# Patient Record
Sex: Male | Born: 1940 | Smoking: Never smoker
Health system: Southern US, Community
[De-identification: ages and names within clinical notes are randomized; demographics above are authoritative.]

---

## 2019-01-06 ENCOUNTER — Inpatient Hospital Stay (HOSPITAL_COMMUNITY)
Admission: AD | Admit: 2019-01-06 | Discharge: 2019-02-06 | DRG: 207 | Disposition: E | Payer: Medicare HMO | Source: Other Acute Inpatient Hospital | Attending: Pulmonary Disease | Admitting: Pulmonary Disease

## 2019-01-06 ENCOUNTER — Inpatient Hospital Stay (HOSPITAL_COMMUNITY): Payer: Medicare HMO

## 2019-01-06 DIAGNOSIS — I10 Essential (primary) hypertension: Secondary | ICD-10-CM | POA: Diagnosis present

## 2019-01-06 DIAGNOSIS — J9601 Acute respiratory failure with hypoxia: Secondary | ICD-10-CM | POA: Diagnosis present

## 2019-01-06 DIAGNOSIS — R739 Hyperglycemia, unspecified: Secondary | ICD-10-CM | POA: Diagnosis not present

## 2019-01-06 DIAGNOSIS — J1289 Other viral pneumonia: Secondary | ICD-10-CM | POA: Diagnosis present

## 2019-01-06 DIAGNOSIS — E875 Hyperkalemia: Secondary | ICD-10-CM | POA: Diagnosis not present

## 2019-01-06 DIAGNOSIS — R1110 Vomiting, unspecified: Secondary | ICD-10-CM | POA: Diagnosis not present

## 2019-01-06 DIAGNOSIS — I959 Hypotension, unspecified: Secondary | ICD-10-CM | POA: Diagnosis not present

## 2019-01-06 DIAGNOSIS — Z79890 Hormone replacement therapy (postmenopausal): Secondary | ICD-10-CM

## 2019-01-06 DIAGNOSIS — M792 Neuralgia and neuritis, unspecified: Secondary | ICD-10-CM | POA: Diagnosis present

## 2019-01-06 DIAGNOSIS — R001 Bradycardia, unspecified: Secondary | ICD-10-CM | POA: Diagnosis present

## 2019-01-06 DIAGNOSIS — N170 Acute kidney failure with tubular necrosis: Secondary | ICD-10-CM | POA: Diagnosis not present

## 2019-01-06 DIAGNOSIS — T17908A Unspecified foreign body in respiratory tract, part unspecified causing other injury, initial encounter: Secondary | ICD-10-CM

## 2019-01-06 DIAGNOSIS — F419 Anxiety disorder, unspecified: Secondary | ICD-10-CM | POA: Diagnosis present

## 2019-01-06 DIAGNOSIS — G20A1 Parkinson's disease without dyskinesia, without mention of fluctuations: Secondary | ICD-10-CM | POA: Diagnosis present

## 2019-01-06 DIAGNOSIS — N179 Acute kidney failure, unspecified: Secondary | ICD-10-CM

## 2019-01-06 DIAGNOSIS — Z95828 Presence of other vascular implants and grafts: Secondary | ICD-10-CM

## 2019-01-06 DIAGNOSIS — D6489 Other specified anemias: Secondary | ICD-10-CM | POA: Diagnosis present

## 2019-01-06 DIAGNOSIS — T380X5A Adverse effect of glucocorticoids and synthetic analogues, initial encounter: Secondary | ICD-10-CM | POA: Diagnosis not present

## 2019-01-06 DIAGNOSIS — J9690 Respiratory failure, unspecified, unspecified whether with hypoxia or hypercapnia: Secondary | ICD-10-CM

## 2019-01-06 DIAGNOSIS — R609 Edema, unspecified: Secondary | ICD-10-CM | POA: Diagnosis not present

## 2019-01-06 DIAGNOSIS — U071 COVID-19: Secondary | ICD-10-CM | POA: Diagnosis present

## 2019-01-06 DIAGNOSIS — T85598A Other mechanical complication of other gastrointestinal prosthetic devices, implants and grafts, initial encounter: Secondary | ICD-10-CM

## 2019-01-06 DIAGNOSIS — E785 Hyperlipidemia, unspecified: Secondary | ICD-10-CM | POA: Diagnosis present

## 2019-01-06 DIAGNOSIS — E039 Hypothyroidism, unspecified: Secondary | ICD-10-CM | POA: Diagnosis present

## 2019-01-06 DIAGNOSIS — G9341 Metabolic encephalopathy: Secondary | ICD-10-CM | POA: Diagnosis not present

## 2019-01-06 DIAGNOSIS — J80 Acute respiratory distress syndrome: Secondary | ICD-10-CM | POA: Diagnosis not present

## 2019-01-06 DIAGNOSIS — G20 Parkinson's disease: Secondary | ICD-10-CM | POA: Diagnosis present

## 2019-01-06 LAB — PHOSPHORUS: Phosphorus: 3.8 mg/dL (ref 2.5–4.6)

## 2019-01-06 LAB — CBC WITH DIFFERENTIAL/PLATELET
Abs Immature Granulocytes: 0.05 10*3/uL (ref 0.00–0.07)
Basophils Absolute: 0 10*3/uL (ref 0.0–0.1)
Basophils Relative: 0 %
Eosinophils Absolute: 0 10*3/uL (ref 0.0–0.5)
Eosinophils Relative: 0 %
HCT: 37.3 % — ABNORMAL LOW (ref 39.0–52.0)
Hemoglobin: 11.7 g/dL — ABNORMAL LOW (ref 13.0–17.0)
Immature Granulocytes: 1 %
Lymphocytes Relative: 4 %
Lymphs Abs: 0.3 10*3/uL — ABNORMAL LOW (ref 0.7–4.0)
MCH: 28.9 pg (ref 26.0–34.0)
MCHC: 31.4 g/dL (ref 30.0–36.0)
MCV: 92.1 fL (ref 80.0–100.0)
Monocytes Absolute: 0.6 10*3/uL (ref 0.1–1.0)
Monocytes Relative: 8 %
Neutro Abs: 6.6 10*3/uL (ref 1.7–7.7)
Neutrophils Relative %: 87 %
Platelets: 152 10*3/uL (ref 150–400)
RBC: 4.05 MIL/uL — ABNORMAL LOW (ref 4.22–5.81)
RDW: 14.3 % (ref 11.5–15.5)
WBC: 7.7 10*3/uL (ref 4.0–10.5)
nRBC: 0 % (ref 0.0–0.2)

## 2019-01-06 LAB — COMPREHENSIVE METABOLIC PANEL
ALT: 14 U/L (ref 0–44)
AST: 24 U/L (ref 15–41)
Albumin: 2.6 g/dL — ABNORMAL LOW (ref 3.5–5.0)
Alkaline Phosphatase: 71 U/L (ref 38–126)
Anion gap: 10 (ref 5–15)
BUN: 42 mg/dL — ABNORMAL HIGH (ref 8–23)
CO2: 21 mmol/L — ABNORMAL LOW (ref 22–32)
Calcium: 8 mg/dL — ABNORMAL LOW (ref 8.9–10.3)
Chloride: 109 mmol/L (ref 98–111)
Creatinine, Ser: 1.12 mg/dL (ref 0.61–1.24)
GFR calc Af Amer: >60 mL/min (ref >60)
GFR calc non Af Amer: >60 mL/min (ref >60)
Glucose, Bld: 99 mg/dL (ref 70–99)
Potassium: 5.2 mmol/L — ABNORMAL HIGH (ref 3.5–5.1)
Sodium: 140 mmol/L (ref 135–145)
Total Bilirubin: 0.5 mg/dL (ref 0.3–1.2)
Total Protein: 5.9 g/dL — ABNORMAL LOW (ref 6.5–8.1)

## 2019-01-06 LAB — POCT I-STAT 7, (LYTES, BLD GAS, ICA,H+H)
Acid-base deficit: 5 mmol/L — ABNORMAL HIGH (ref 0.0–2.0)
Bicarbonate: 19.5 mmol/L — ABNORMAL LOW (ref 20.0–28.0)
Calcium, Ion: 1.17 mmol/L (ref 1.15–1.40)
HCT: 36 % — ABNORMAL LOW (ref 39.0–52.0)
Hemoglobin: 12.2 g/dL — ABNORMAL LOW (ref 13.0–17.0)
O2 Saturation: 87 %
Patient temperature: 98.7
Potassium: 5.1 mmol/L (ref 3.5–5.1)
Sodium: 138 mmol/L (ref 135–145)
TCO2: 21 mmol/L — ABNORMAL LOW (ref 22–32)
pCO2 arterial: 35.7 mmHg (ref 32.0–48.0)
pH, Arterial: 7.346 — ABNORMAL LOW (ref 7.350–7.450)
pO2, Arterial: 55 mmHg — ABNORMAL LOW (ref 83.0–108.0)

## 2019-01-06 LAB — BRAIN NATRIURETIC PEPTIDE: B Natriuretic Peptide: 177 pg/mL — ABNORMAL HIGH (ref 0.0–100.0)

## 2019-01-06 LAB — GLUCOSE, CAPILLARY: Glucose-Capillary: 93 mg/dL (ref 70–99)

## 2019-01-06 LAB — TROPONIN I (HIGH SENSITIVITY): Troponin I (High Sensitivity): 92 ng/L — ABNORMAL HIGH (ref <18)

## 2019-01-06 LAB — LACTATE DEHYDROGENASE: LDH: 510 U/L — ABNORMAL HIGH (ref 98–192)

## 2019-01-06 LAB — D-DIMER, QUANTITATIVE: D-Dimer, Quant: 13.06 ug/mL-FEU — ABNORMAL HIGH (ref 0.00–0.50)

## 2019-01-06 LAB — FERRITIN: Ferritin: 744 ng/mL — ABNORMAL HIGH (ref 24–336)

## 2019-01-06 LAB — MAGNESIUM: Magnesium: 2.3 mg/dL (ref 1.7–2.4)

## 2019-01-06 LAB — PROCALCITONIN: Procalcitonin: 2.43 ng/mL

## 2019-01-06 LAB — FIBRINOGEN: Fibrinogen: 442 mg/dL (ref 210–475)

## 2019-01-06 LAB — C-REACTIVE PROTEIN: CRP: 11.4 mg/dL — ABNORMAL HIGH (ref <1.0)

## 2019-01-06 MED ORDER — DEXAMETHASONE 6 MG PO TABS
6.0000 mg | ORAL_TABLET | ORAL | Status: DC
  Administered 2019-01-07 – 2019-01-15 (×9): 6 mg
  Filled 2019-01-06 (×10): qty 1

## 2019-01-06 MED ORDER — ORAL CARE MOUTH RINSE
15.0000 mL | OROMUCOSAL | Status: DC
  Administered 2019-01-06 – 2019-01-16 (×93): 15 mL via OROMUCOSAL

## 2019-01-06 MED ORDER — FENTANYL CITRATE (PF) 100 MCG/2ML IJ SOLN: 25.0000 ug | INTRAMUSCULAR | Status: DC | PRN

## 2019-01-06 MED ORDER — ALFENTANIL HCL 500 MCG/ML IJ INJ
20.00 | INJECTION | INTRAMUSCULAR | Status: DC
Start: 2019-01-06 — End: 2019-01-06

## 2019-01-06 MED ORDER — AMOXICILLIN-POT CLAVULANATE (PENICILLIN COMBINATIONS)
325.00 | Status: DC
Start: 2019-01-07 — End: 2019-01-06

## 2019-01-06 MED ORDER — VITAL HIGH PROTEIN PO LIQD
1000.0000 mL | ORAL | Status: DC
  Administered 2019-01-06: 1000 mL

## 2019-01-06 MED ORDER — SODIUM CHLORIDE 0.9 % IV SOLN
100.0000 mg | Freq: Once | INTRAVENOUS | Status: AC
  Administered 2019-01-06: 100 mg via INTRAVENOUS
  Filled 2019-01-06: qty 100

## 2019-01-06 MED ORDER — CALCIUM-VITAMIN D-VITAMIN K 600-1000-90 MG-UNT-MCG PO TABS
0.00 | ORAL_TABLET | ORAL | Status: DC
Start: ? — End: 2019-01-06

## 2019-01-06 MED ORDER — PRO-STAT SUGAR FREE PO LIQD
30.0000 mL | Freq: Two times a day (BID) | ORAL | Status: DC
  Administered 2019-01-06 – 2019-01-07 (×2): 30 mL
  Filled 2019-01-06 (×2): qty 30

## 2019-01-06 MED ORDER — EQL SHEER SPOTS SMALL MISC
0.00 | Status: DC
Start: ? — End: 2019-01-06

## 2019-01-06 MED ORDER — EMBRACE TOPFILL 1000ML BAG SET MISC
1.00 | Status: DC
Start: 2019-01-06 — End: 2019-01-06

## 2019-01-06 MED ORDER — DOCUSATE SODIUM 50 MG/5ML PO LIQD: 100.0000 mg | Freq: Two times a day (BID) | ORAL | Status: DC | PRN

## 2019-01-06 MED ORDER — PROPOFOL 1000 MG/100ML IV EMUL
0.0000 ug/kg/min | INTRAVENOUS | Status: DC
  Administered 2019-01-06: 30 ug/kg/min via INTRAVENOUS

## 2019-01-06 MED ORDER — LEVOTHYROXINE SODIUM 100 MCG PO TABS
100.0000 ug | ORAL_TABLET | Freq: Every day | ORAL | Status: DC
  Administered 2019-01-07 – 2019-01-15 (×9): 100 ug
  Filled 2019-01-06 (×11): qty 1

## 2019-01-06 MED ORDER — ONDANSETRON HCL 4 MG/2ML IJ SOLN: 4.0000 mg | Freq: Four times a day (QID) | INTRAMUSCULAR | Status: DC | PRN

## 2019-01-06 MED ORDER — ACETAMINOPHEN 325 MG PO TABS: 650.0000 mg | ORAL_TABLET | ORAL | Status: DC | PRN

## 2019-01-06 MED ORDER — CELLULOSE SODIUM PHOSPHATE VI
5000.00 | Status: DC
Start: 2019-01-06 — End: 2019-01-06

## 2019-01-06 MED ORDER — NEXTERONE 150-4.21 MG/100ML-% IV SOLN
50.00 | INTRAVENOUS | Status: DC
Start: 2019-01-06 — End: 2019-01-06

## 2019-01-06 MED ORDER — VITAMIN C 500 MG PO TABS
500.0000 mg | ORAL_TABLET | Freq: Every day | ORAL | Status: DC
  Administered 2019-01-06 – 2019-01-11 (×6): 500 mg
  Filled 2019-01-06 (×6): qty 1

## 2019-01-06 MED ORDER — PROPOFOL 1000 MG/100ML IV EMUL: 0.0000 ug/kg/min | INTRAVENOUS | Status: DC

## 2019-01-06 MED ORDER — MIDAZOLAM HCL 2 MG/2ML IJ SOLN
1.0000 mg | INTRAMUSCULAR | Status: DC | PRN
  Filled 2019-01-06: qty 2

## 2019-01-06 MED ORDER — MESNA 400 MG PO TABS
100.00 | ORAL_TABLET | ORAL | Status: DC
Start: 2019-01-07 — End: 2019-01-06

## 2019-01-06 MED ORDER — SODIUM CHLORIDE 0.9 % IV SOLN
100.0000 mg | Freq: Every day | INTRAVENOUS | Status: AC
  Administered 2019-01-07 – 2019-01-09 (×3): 100 mg via INTRAVENOUS
  Filled 2019-01-06 (×3): qty 100

## 2019-01-06 MED ORDER — DOVER URINARY LEG BAG MISC
Status: DC
Start: ? — End: 2019-01-06

## 2019-01-06 MED ORDER — Medication
100.00 | Status: DC
Start: 2019-01-07 — End: 2019-01-06

## 2019-01-06 MED ORDER — WATER BOTTLE ECONOMY #15 MISC
20.00 | Status: DC
Start: 2019-01-06 — End: 2019-01-06

## 2019-01-06 MED ORDER — FAMOTIDINE IN NACL 20-0.9 MG/50ML-% IV SOLN
20.0000 mg | Freq: Two times a day (BID) | INTRAVENOUS | Status: DC
  Administered 2019-01-06 – 2019-01-10 (×8): 20 mg via INTRAVENOUS
  Filled 2019-01-06 (×8): qty 50

## 2019-01-06 MED ORDER — MIDAZOLAM HCL 2 MG/2ML IJ SOLN
1.0000 mg | INTRAMUSCULAR | Status: AC | PRN
  Administered 2019-01-06 – 2019-01-07 (×3): 1 mg via INTRAVENOUS
  Filled 2019-01-06 (×2): qty 2

## 2019-01-06 MED ORDER — Medication
100.00 | Status: DC
Start: 2019-01-06 — End: 2019-01-06

## 2019-01-06 MED ORDER — NALOXONE HCL 0.4 MG/ML IJ SOLN [OVERRIDE RECORD] [AGE NEONATE]
0.00 | Status: DC
Start: ? — End: 2019-01-06

## 2019-01-06 MED ORDER — Medication
5.00 | Status: DC
Start: 2019-01-06 — End: 2019-01-06

## 2019-01-06 MED ORDER — ZINC SULFATE 220 (50 ZN) MG PO CAPS
220.0000 mg | ORAL_CAPSULE | Freq: Every day | ORAL | Status: DC
  Administered 2019-01-06 – 2019-01-11 (×6): 220 mg
  Filled 2019-01-06 (×6): qty 1

## 2019-01-06 MED ORDER — CHLORHEXIDINE GLUCONATE 0.12% ORAL RINSE (MEDLINE KIT)
15.0000 mL | Freq: Two times a day (BID) | OROMUCOSAL | Status: DC
  Administered 2019-01-06 – 2019-01-15 (×19): 15 mL via OROMUCOSAL

## 2019-01-06 MED ORDER — V-R ANTI-GAS MAXIMUM STRENGTH 166 MG PO CAPS
6.00 | ORAL_CAPSULE | ORAL | Status: DC
Start: 2019-01-07 — End: 2019-01-06

## 2019-01-06 MED ORDER — FENTANYL 2500MCG IN NS 250ML (10MCG/ML) PREMIX INFUSION
0.0000 ug/h | INTRAVENOUS | Status: DC
  Administered 2019-01-06: 25 ug/h via INTRAVENOUS
  Administered 2019-01-07: 350 ug/h via INTRAVENOUS
  Administered 2019-01-07: 300 ug/h via INTRAVENOUS
  Administered 2019-01-07 – 2019-01-11 (×12): 350 ug/h via INTRAVENOUS
  Administered 2019-01-11 (×2): 325 ug/h via INTRAVENOUS
  Administered 2019-01-12 (×3): 300 ug/h via INTRAVENOUS
  Administered 2019-01-13: 400 ug/h via INTRAVENOUS
  Administered 2019-01-13 (×2): 300 ug/h via INTRAVENOUS
  Administered 2019-01-14 – 2019-01-15 (×5): 350 ug/h via INTRAVENOUS
  Filled 2019-01-06 (×24): qty 250

## 2019-01-06 MED ORDER — Medication
1.00 | Status: DC
Start: ? — End: 2019-01-06

## 2019-01-06 MED ORDER — ENOXAPARIN SODIUM 40 MG/0.4ML ~~LOC~~ SOLN
40.0000 mg | Freq: Two times a day (BID) | SUBCUTANEOUS | Status: DC
  Administered 2019-01-06 – 2019-01-15 (×19): 40 mg via SUBCUTANEOUS
  Filled 2019-01-06 (×19): qty 0.4

## 2019-01-06 MED ORDER — Medication
2.00 | Status: DC
Start: 2019-01-07 — End: 2019-01-06

## 2019-01-06 NOTE — Progress Notes (Signed)
[place] Progress Note Patient Name: ROLLY MAGRI Sr. DOB: 03-10-1940 MRN: [id number]   Date of Service  Jan 08, 2019  HPI/Events of Note  Bradycardia - Likely d/t Propofol IV infusion.   eICU Interventions  Will order: 1. D/C Propofol IV infusion. 2. Fentanyl IV infusion. Titrate to RASS = 0 to -1.         Markeita Alicia Cornelia Copa January 08, 2019, 8:23 PM

## 2019-01-06 NOTE — H&P (Signed)
NAME:  Carl Piedra., MRN:  [id number], DOB:  Sep 10, 1940, LOS: 0 ADMISSION DATE:  01/29/2019, CONSULTATION DATE:  01/29/2019 REFERRING MD: Venora Maples , CHIEF COMPLAINT:  dyspnea   Brief History   78 y/o male with baseline hypertension, parkinson's disease admitted on 12/1 with COVID pneumonia causing severe acute respiratory failure with hypoxemia.   History of present illness   78 year old male presented on transfer from [facility] today after he was intubated at 2 AM in the setting of severe acute respiratory failure with hypoxemia from COVID-19 pneumonia.  He had a Covid test 7 days prior to arrival on November 23 which was positive.  His daughter was also positive. .  He had been experiencing chills and body aches.  He developed shortness of breath and came to the emergency department on 11/30.  He was initially treated with heated high flow oxygen but his oxygen saturation dropped and so he was intubated around 2 AM on January 06, 2019.  He was transferred to our facility because no other hospitals had beds available.  Past Medical History  Hypertension Parkinson's disease Hypothyroidism Peripheral neuralgia  Significant Hospital Events   December 1 admission  Consults:    Procedures:  December 1 endotracheal tube  Significant Diagnostic Tests:    Micro Data:  11/23 SARS COV 2 positive OSH  Antimicrobials/COVID Rx  12/1 decadron  12/1 remdesivir  Interim history/subjective:  As above  Objective   There were no vitals taken for this visit. PAP: ()/()      No intake or output data in the 24 hours ending 01/07/2019 1848 There were no vitals filed for this visit.  Examination:  General:  In bed on vent HENT: NCAT ETT in place PULM: CTA B, vent supported breathing CV: RRR, no mgr GI: BS+, soft, nontender MSK: normal bulk and tone Neuro: sedated on vent  I reviewed the records from his ER visit at [facility] where he was noted to  have mild acute kidney injury with a creatinine of 1.6.  Labs are otherwise unremarkable.  Resolved Hospital Problem list     Assessment & Plan:  Severe acute respiratory failure with hypoxemia secondary to COVID-19 pneumonia: Admit to ICU Chest x-ray Decadron Remdesivir Check LFTs, hepatitis function to consider Actemra Consider convalescent plasma Continue mechanical ventilation per ARDS protocol Target TVol 6-8cc/kgIBW Target Plateau Pressure < 30cm H20 Target driving pressure less than 15 cm of water Target PaO2 55-65: titrate PEEP/FiO2 per protocol As long as PaO2 to FiO2 ratio is less than 1:150 position in prone position for 16 hours a day Check CVP daily if CVL in place Target CVP less than 4, diurese as necessary Ventilator associated pneumonia prevention protocol  AKI Repeat labs Start tube feeding  Need for sedation for mechanical ventilation PAD protocol Goal RASS -2 Propofol infusion Prn fentanyl and versed  Parkinson's Restart sinemet on 12/2  Hypothyroid syntrhoid 149mcg daily  Best practice:  Diet: tube feeding Pain/Anxiety/Delirium protocol (if indicated): yes VAP protocol (if indicated): yes DVT prophylaxis: lovenox per ICU protocol GI prophylaxis: famotidine Glucose control: SSI Mobility: bed rest Code Status: full Family Communication: called daughter, left message Disposition: remain in ICU  Labs   CBC: No results for input(s): WBC, NEUTROABS, HGB, HCT, MCV, PLT in the last 168 hours.  Basic Metabolic Panel: No results for input(s): NA, K, CL, CO2, GLUCOSE, BUN, CREATININE, CALCIUM, MG, PHOS in the last 168 hours. GFR: CrCl cannot be calculated (No successful lab  value found.). No results for input(s): PROCALCITON, WBC, LATICACIDVEN in the last 168 hours.  Liver Function Tests: No results for input(s): AST, ALT, ALKPHOS, BILITOT, PROT, ALBUMIN in the last 168 hours. No results for input(s): LIPASE, AMYLASE in the last 168 hours. No  results for input(s): AMMONIA in the last 168 hours.  ABG No results found for: PHART, PCO2ART, PO2ART, HCO3, TCO2, ACIDBASEDEF, O2SAT   Coagulation Profile: No results for input(s): INR, PROTIME in the last 168 hours.  Cardiac Enzymes: No results for input(s): CKTOTAL, CKMB, CKMBINDEX, TROPONINI in the last 168 hours.  HbA1C: No results found for: HGBA1C  CBG: No results for input(s): GLUCAP in the last 168 hours.  Review of Systems:   Cannot obtain due intubation  Past Medical History  Hypertension Parkinson's hypothyroid  Surgical History   Cannot obtain  Social History      Family History   His family history is not on file.   Allergies Not on File   Home Medications  Prior to Admission medications   Not on File     Critical care time: 45 minutes    Heber [place], MD [place] [facility] Pager: [phone] Cell: [phone] If no response, call [phone]

## 2019-01-06 NOTE — Progress Notes (Signed)
Pharmacy Note - Remdesivir Dosing  78 yo male transferred from [facility] today with severe acute respiratory failure with hypoxemia from COVID-19 pneumonia.   O:  ALT: 15 Requiring supplemental O2: intubated    A/P:  Patient meets criteria for remdesivir.  Per review of Care Everywhere, patient received remdesivir 200 mg IV x 1 on 01/05/19 at 16:35.  Will continue with 100 mg IV daily x 4 days to complete therapy on 12/4 Monitor ALT, clinical progress  Peggyann Juba, PharmD, [place] [phone] 02/04/2019 7:15 PM

## 2019-01-06 NOTE — Progress Notes (Signed)
Received pt from [facility] on ventilator. Pt stable throughout with no complications. ETT secure at 24.

## 2019-01-06 NOTE — Progress Notes (Signed)
Pt arrived to unit via stretcher accompanied by RNs. Pt intubated and sedated with Fentanyl and Propofol infusing through 2 PIVs. Sinus brady in the 40s with SBP in the 130s. Pt on ventilator with FiO2 settings at 100% PEEP of 12, oxygen saturation at 100%. OG tube in place, clamped. Foley catheter present upon arrival. Propofol continued at 30 mcg/kg/min. Dr. [place] Bells present to evaluate pt. No s/s of distress at this time.

## 2019-01-07 ENCOUNTER — Encounter (HOSPITAL_COMMUNITY): Payer: Self-pay

## 2019-01-07 ENCOUNTER — Other Ambulatory Visit: Payer: Self-pay

## 2019-01-07 DIAGNOSIS — J80 Acute respiratory distress syndrome: Secondary | ICD-10-CM | POA: Diagnosis not present

## 2019-01-07 DIAGNOSIS — G20 Parkinson's disease: Secondary | ICD-10-CM | POA: Diagnosis present

## 2019-01-07 DIAGNOSIS — U071 COVID-19: Secondary | ICD-10-CM | POA: Diagnosis not present

## 2019-01-07 DIAGNOSIS — G20A1 Parkinson's disease without dyskinesia, without mention of fluctuations: Secondary | ICD-10-CM | POA: Diagnosis present

## 2019-01-07 DIAGNOSIS — R001 Bradycardia, unspecified: Secondary | ICD-10-CM | POA: Diagnosis not present

## 2019-01-07 LAB — TYPE AND SCREEN
ABO/RH(D): A POS
Antibody Screen: NEGATIVE

## 2019-01-07 LAB — CBC
HCT: 37.3 % — ABNORMAL LOW (ref 39.0–52.0)
Hemoglobin: 11.5 g/dL — ABNORMAL LOW (ref 13.0–17.0)
MCH: 29.2 pg (ref 26.0–34.0)
MCHC: 30.8 g/dL (ref 30.0–36.0)
MCV: 94.7 fL (ref 80.0–100.0)
Platelets: 151 10*3/uL (ref 150–400)
RBC: 3.94 MIL/uL — ABNORMAL LOW (ref 4.22–5.81)
RDW: 14.4 % (ref 11.5–15.5)
WBC: 7.6 10*3/uL (ref 4.0–10.5)
nRBC: 0 % (ref 0.0–0.2)

## 2019-01-07 LAB — BASIC METABOLIC PANEL
Anion gap: 11 (ref 5–15)
BUN: 46 mg/dL — ABNORMAL HIGH (ref 8–23)
CO2: 20 mmol/L — ABNORMAL LOW (ref 22–32)
Calcium: 8 mg/dL — ABNORMAL LOW (ref 8.9–10.3)
Chloride: 110 mmol/L (ref 98–111)
Creatinine, Ser: 1 mg/dL (ref 0.61–1.24)
GFR calc Af Amer: >60 mL/min (ref >60)
GFR calc non Af Amer: >60 mL/min (ref >60)
Glucose, Bld: 96 mg/dL (ref 70–99)
Potassium: 5 mmol/L (ref 3.5–5.1)
Sodium: 141 mmol/L (ref 135–145)

## 2019-01-07 LAB — GLUCOSE, CAPILLARY
Glucose-Capillary: 109 mg/dL — ABNORMAL HIGH (ref 70–99)
Glucose-Capillary: 150 mg/dL — ABNORMAL HIGH (ref 70–99)
Glucose-Capillary: 162 mg/dL — ABNORMAL HIGH (ref 70–99)
Glucose-Capillary: 89 mg/dL (ref 70–99)
Glucose-Capillary: 95 mg/dL (ref 70–99)
Glucose-Capillary: 99 mg/dL (ref 70–99)

## 2019-01-07 LAB — PHOSPHORUS
Phosphorus: 4.2 mg/dL (ref 2.5–4.6)
Phosphorus: 4 mg/dL (ref 2.5–4.6)

## 2019-01-07 LAB — ABO/RH: ABO/RH(D): A POS

## 2019-01-07 LAB — TRIGLYCERIDES: Triglycerides: 117 mg/dL (ref <150)

## 2019-01-07 LAB — HEPATITIS B SURFACE ANTIGEN: Hepatitis B Surface Ag: NONREACTIVE

## 2019-01-07 LAB — MAGNESIUM
Magnesium: 2.3 mg/dL (ref 1.7–2.4)
Magnesium: 2.3 mg/dL (ref 1.7–2.4)

## 2019-01-07 LAB — MRSA PCR SCREENING: MRSA by PCR: NEGATIVE

## 2019-01-07 MED ORDER — VECURONIUM BROMIDE 10 MG IV SOLR
INTRAVENOUS | Status: AC
  Administered 2019-01-07: 7.4 mg via INTRAVENOUS
  Filled 2019-01-07: qty 10

## 2019-01-07 MED ORDER — CARBIDOPA-LEVODOPA 25-100 MG PO TABS
1.0000 | ORAL_TABLET | Freq: Three times a day (TID) | ORAL | Status: DC
  Administered 2019-01-07 – 2019-01-15 (×26): 1
  Filled 2019-01-07 (×31): qty 1

## 2019-01-07 MED ORDER — VITAL AF 1.2 CAL PO LIQD
1000.0000 mL | ORAL | Status: DC
  Administered 2019-01-07 – 2019-01-08 (×2): 1000 mL

## 2019-01-07 MED ORDER — POLYVINYL ALCOHOL 1.4 % OP SOLN
1.0000 [drp] | Freq: Three times a day (TID) | OPHTHALMIC | Status: DC | PRN
  Administered 2019-01-07 – 2019-01-15 (×4): 1 [drp] via OPHTHALMIC
  Filled 2019-01-07: qty 15

## 2019-01-07 MED ORDER — MIDAZOLAM 50MG/50ML (1MG/ML) PREMIX INFUSION
0.5000 mg/h | INTRAVENOUS | Status: DC
  Filled 2019-01-07: qty 50

## 2019-01-07 MED ORDER — POLYETHYLENE GLYCOL 3350 17 G PO PACK
17.0000 g | PACK | Freq: Every evening | ORAL | Status: DC
  Administered 2019-01-07 – 2019-01-14 (×6): 17 g
  Filled 2019-01-07 (×6): qty 1

## 2019-01-07 MED ORDER — MIDAZOLAM 50MG/50ML (1MG/ML) PREMIX INFUSION
2.0000 mg/h | INTRAVENOUS | Status: DC
  Administered 2019-01-08: 4 mg/h via INTRAVENOUS
  Administered 2019-01-08: 3 mg/h via INTRAVENOUS
  Administered 2019-01-09: 6 mg/h via INTRAVENOUS
  Administered 2019-01-09 (×2): 4 mg/h via INTRAVENOUS
  Administered 2019-01-10 – 2019-01-12 (×5): 6 mg/h via INTRAVENOUS
  Administered 2019-01-12: 5 mg/h via INTRAVENOUS
  Administered 2019-01-12: 6 mg/h via INTRAVENOUS
  Administered 2019-01-13 (×2): 5 mg/h via INTRAVENOUS
  Administered 2019-01-14 (×2): 7 mg/h via INTRAVENOUS
  Administered 2019-01-14: 6 mg/h via INTRAVENOUS
  Administered 2019-01-15: 8 mg/h via INTRAVENOUS
  Administered 2019-01-15 (×2): 7 mg/h via INTRAVENOUS
  Administered 2019-01-15: 8 mg/h via INTRAVENOUS
  Filled 2019-01-07 (×25): qty 50

## 2019-01-07 MED ORDER — FREE WATER
100.0000 mL | Status: DC
  Administered 2019-01-07 – 2019-01-12 (×29): 100 mL

## 2019-01-07 MED ORDER — ARTIFICIAL TEARS OPHTHALMIC OINT
TOPICAL_OINTMENT | Freq: Three times a day (TID) | OPHTHALMIC | Status: DC
  Administered 2019-01-07 (×3): via OPHTHALMIC
  Filled 2019-01-07: qty 3.5
  Filled 2019-01-07: qty 7

## 2019-01-07 MED ORDER — ASPIRIN 81 MG PO CHEW
81.0000 mg | CHEWABLE_TABLET | Freq: Every day | ORAL | Status: DC
  Administered 2019-01-07 – 2019-01-11 (×5): 81 mg
  Filled 2019-01-07 (×5): qty 1

## 2019-01-07 MED ORDER — VECURONIUM BROMIDE 10 MG IV SOLR
0.1000 mg/kg | INTRAVENOUS | Status: DC | PRN
  Administered 2019-01-07 (×3): 7.4 mg via INTRAVENOUS
  Filled 2019-01-07 (×2): qty 10

## 2019-01-07 MED ORDER — FENTANYL 2500MCG IN NS 250ML (10MCG/ML) PREMIX INFUSION
25.0000 ug/h | INTRAVENOUS | Status: DC
  Filled 2019-01-07 (×4): qty 250

## 2019-01-07 MED ORDER — FENTANYL BOLUS VIA INFUSION
25.0000 ug | INTRAVENOUS | Status: DC | PRN
  Administered 2019-01-09: 25 ug via INTRAVENOUS
  Filled 2019-01-07: qty 25

## 2019-01-07 MED ORDER — CHLORHEXIDINE GLUCONATE CLOTH 2 % EX PADS
6.0000 | MEDICATED_PAD | Freq: Every day | CUTANEOUS | Status: DC
  Administered 2019-01-07 – 2019-01-15 (×8): 6 via TOPICAL

## 2019-01-07 MED ORDER — VECURONIUM BROMIDE 10 MG IV SOLR: 0.1000 mg/kg | INTRAVENOUS | Status: DC | PRN

## 2019-01-07 MED ORDER — ROSUVASTATIN CALCIUM 20 MG PO TABS
20.0000 mg | ORAL_TABLET | Freq: Every day | ORAL | Status: DC
  Administered 2019-01-07 – 2019-01-11 (×5): 20 mg
  Filled 2019-01-07 (×5): qty 1

## 2019-01-07 MED ORDER — MIDAZOLAM HCL 2 MG/2ML IJ SOLN
2.0000 mg | INTRAMUSCULAR | Status: DC | PRN
  Administered 2019-01-07 (×3): 2 mg via INTRAVENOUS
  Filled 2019-01-07 (×2): qty 2

## 2019-01-07 MED ORDER — VECURONIUM BROMIDE 10 MG IV SOLR: 0.1000 mg/kg | Freq: Once | INTRAVENOUS | Status: AC

## 2019-01-07 MED ORDER — VECURONIUM BROMIDE 10 MG IV SOLR
0.1000 mg/kg | INTRAVENOUS | Status: DC | PRN
  Administered 2019-01-07 – 2019-01-14 (×10): 7.4 mg via INTRAVENOUS
  Filled 2019-01-07 (×10): qty 10

## 2019-01-07 MED ORDER — MIDAZOLAM BOLUS VIA INFUSION
1.0000 mg | INTRAVENOUS | Status: DC | PRN
  Administered 2019-01-09: 2 mg via INTRAVENOUS
  Filled 2019-01-07: qty 2

## 2019-01-07 MED ORDER — ARTIFICIAL TEARS OPHTHALMIC OINT
1.0000 "application " | TOPICAL_OINTMENT | Freq: Three times a day (TID) | OPHTHALMIC | Status: DC
  Administered 2019-01-07 – 2019-01-15 (×25): 1 via OPHTHALMIC
  Filled 2019-01-07: qty 3.5

## 2019-01-07 MED ORDER — FENTANYL CITRATE (PF) 100 MCG/2ML IJ SOLN: 25.0000 ug | Freq: Once | INTRAMUSCULAR | Status: AC

## 2019-01-07 MED ORDER — PREGABALIN 50 MG PO CAPS
50.0000 mg | ORAL_CAPSULE | Freq: Three times a day (TID) | ORAL | Status: DC
  Administered 2019-01-07 – 2019-01-11 (×15): 50 mg
  Filled 2019-01-07 (×15): qty 1

## 2019-01-07 MED ORDER — CITALOPRAM HYDROBROMIDE 20 MG PO TABS
20.0000 mg | ORAL_TABLET | Freq: Every evening | ORAL | Status: DC
  Administered 2019-01-07 – 2019-01-15 (×9): 20 mg
  Filled 2019-01-07 (×10): qty 1

## 2019-01-07 MED ORDER — BUSPIRONE HCL 5 MG PO TABS
5.0000 mg | ORAL_TABLET | Freq: Two times a day (BID) | ORAL | Status: DC
  Administered 2019-01-07 – 2019-01-11 (×10): 5 mg
  Filled 2019-01-07 (×11): qty 1

## 2019-01-07 NOTE — Progress Notes (Addendum)
Initial Nutrition Assessment RD working remotely.  DOCUMENTATION CODES:   Not applicable  INTERVENTION:    Vital AF 1.2 at 75 ml/h (1800 ml per day).   Provides 2160 kcal, 135 gm protein, 1460 ml free water daily.   Free water flushes 100 ml every 4 hours for a total of 2060 ml per day total.  NUTRITION DIAGNOSIS:   Increased nutrient needs related to acute illness(COVID-19) as evidenced by estimated needs.  GOAL:   Patient will meet greater than or equal to 90% of their needs  MONITOR:   Vent status, TF tolerance, Labs  REASON FOR ASSESSMENT:   Ventilator, Consult Enteral/tube feeding initiation and management  ASSESSMENT:   78 yo male admitted with COVID PNA requring intubation. Tested positive for COVID 19 on 11/23. PMH includes HTN, Parkinson's disease, hypothyroidism, peripheral neuralgia.   Received MD Consult for TF initiation and management. Cortrak placed today, tip is postpyloric.  Patient is currently intubated on ventilator support MV: 11.9 L/min Temp (24hrs), Avg:98.7 F (37.1 C), Min:97.8 F (36.6 C), Max:99.8 F (37.7 C)  Propofol: off   Labs reviewed.  CBG's: [phone]  Medications reviewed and include decadron, miralax, vitamin C, zinc sulfate.  NUTRITION - FOCUSED PHYSICAL EXAM:  deferred  Diet Order:   Diet Order    None      EDUCATION NEEDS:   Not appropriate for education at this time  Skin:  Skin Assessment: Reviewed RN Assessment  Last BM:  No BM documented  Height:   Ht Readings from Last 1 Encounters:  01/07/19 5\' 7"  (1.702 m)    Weight:   Wt Readings from Last 1 Encounters:  01/07/19 73.7 kg    Ideal Body Weight:  67.3 kg  BMI:  Body mass index is 25.45 kg/m.  Estimated Nutritional Needs:   Kcal:  1900-2200  Protein:  110-130 gm  Fluid:  >/= 1.9 L    Molli Barrows, RD, LDN, [place] Pager [phone] After Hours Pager [phone]

## 2019-01-07 NOTE — Progress Notes (Signed)
Spoke with daughter Clarene Critchley and updated her. She stated that she or the pts son Dayvon will be calling for updated. I educated her regarding the policies of phone calls at [facility] and she stated she understood. Her number is [phone]

## 2019-01-07 NOTE — Procedures (Signed)
Cortrak  Person Inserting Tube:  Maylon Peppers C, RD Tube Type:  Cortrak - 43 inches Tube Location:  Left nare Initial Placement:  Postpyloric Secured by: Bridle Technique Used to Measure Tube Placement:  Documented cm marking at nare/ corner of mouth Cortrak Secured At:  85 cm    Cortrak Tube Team Note:  Consult received to place a Cortrak feeding tube.   No x-ray is required. RN may begin using tube.   If the tube becomes dislodged please keep the tube and contact the Cortrak team at [email or web address] (password [email or web address]) for replacement.  If after hours and replacement cannot be delayed, place a NG tube and confirm placement with an abdominal x-ray.    Harvey [place], [place], [place] Pager [phone] After Hours Pager

## 2019-01-07 NOTE — Progress Notes (Addendum)
[place] Progress Note Patient Name: ADELARD SANON Sr. DOB: 03-21-1940 MRN: [id number]   Date of Service  01/07/2019  HPI/Events of Note  Ventilator asynchrony - In spite of heavy sedation with a Fentanyl IV infusion and Versed IV PRN.   eICU Interventions  Will order: 1. Vecuronium 7.4 mg IV Q 1 hour PRN ventilator asynchrony.  2. Lacralube to OU Q 8 hours.      Intervention Category Major Interventions: Respiratory failure - evaluation and management  Sommer,Steven Eugene 01/07/2019, 1:03 AM

## 2019-01-07 NOTE — Progress Notes (Signed)
Primary spokesperson POA Helene Kelp updated at this time  Answered questions appropriately and gave them updated POC info.   Only question was possibly of "COVID retest" reassured them team would be aware of consideration. Helene Kelp (daughter) and son Hakeen Shipes) state no one in their family has tested positive despite being around patient frequently

## 2019-01-07 NOTE — Progress Notes (Signed)
NAME:  Carl Nguyen., MRN:  [id number], DOB:  1940/06/01, LOS: 1 ADMISSION DATE:  01/07/2019, CONSULTATION DATE:  01/26/2019 REFERRING MD: Venora Maples , CHIEF COMPLAINT:  dyspnea   Brief History   78 y/o male with baseline hypertension, parkinson's disease who presented to [facility] with increasing SOB.  Diagnosed with COVID on 11/23 and his daughter is positive as well.  Admitted to [facility] on 11/30 and treated with HFNC but O2 level dropped early am (daughter reports to the 16's) on 12/1 and he was intubated.  Patient transferred to [facility] on 12/1 for care of severe acute respiratory failure with hypoxemia in setting of COVID PNA.  Past Medical History  Hypertension Parkinson's disease Hypothyroidism Peripheral neuralgia R Knee Swelling - 2 weeks prior to admit had a tap of fluid   Significant Hospital Events   12/01 Admit to [facility]:    Procedures:  ETT 12/1 >>  Significant Diagnostic Tests:    Micro Data:  SARS COV 2 11/23 >> positive OSH  Antimicrobials/COVID Rx  Decadron 12/1 >> Remdesivir 12/1 >>  Interim history/subjective:  Afebrile.  Difficulties with ventilator synchrony overnight > sedation changed from propofol to fentanyl/versed due to bradycardia.  Then required intermittent paralytics overnight.  RN reports pt remains on fentanyl gtt with PRN versed/vecuronium.  HR stable in the high 40's on fentanyl gtt.    Objective   Blood pressure 123/67, pulse (!) 50, temperature 98.7 F (37.1 C), temperature source Oral, resp. rate (!) 7, weight 73.7 kg, SpO2 92 %.    Vent Mode: PRVC FiO2 (%):  [80 %-100 %] 80 % Set Rate:  [24 bmp] 24 bmp Vt Set:  [500 mL] 500 mL PEEP:  [12 cmH20] 12 cmH20 Plateau Pressure:  [22 cmH20-26 cmH20] 22 cmH20   Intake/Output Summary (Last 24 hours) at 01/07/2019 0804 Last data filed at 01/07/2019 0600 Gross per 24 hour  Intake 1198.12 ml  Output 570 ml  Net 628.12 ml   Filed Weights   01/23/2019 2000  01/07/19 0323  Weight: 73.7 kg 73.7 kg    Examination: General: thin elderly male lying in bed on vent in NAD, appears critically ill  HEENT: MM pink/moist, ETT, pupils 55mm reactive, anicteric, lids/lashes normal Neuro: sedate on fentanyl  CV: s1s2 rrr, SB on monitor, no m/r/g PULM:  Even/non-labored on vent, lungs bilaterally coarse without wheezing GI: soft, bsx4 active, tolerating TF Extremities: warm/dry, no edema  Skin: no rashes or lesions  Resolved Hospital Problem list     Assessment & Plan:   Severe Acute Respiratory Failure with Hypoxemia secondary to COVID-19 Pneumonia -continue decadron, remdesivir -LFT's wnl, hepatitis B negative > defer actemra decision to MD -low Vt ventilation 4-8cc/kg -goal plateau pressure <30, driving pressure <11 cm H2O -target PaO2 55-65, titrate PEEP/FiO2 per ARDS protocol  -if P/F ratio <150, consider prone therapy for 16 hours per day -goal CVP <4, diuresis as necessary -VAP prevention measures  -follow intermittent CXR   AKI Baseline sr cr ~ 1, improved -Trend BMP / urinary output -Replace electrolytes as indicated -Avoid nephrotoxic agents, ensure adequate renal perfusion  Need for sedation for mechanical ventilation -PAD protocol  -RASS Goal: -2 to -3 with vent synchrony  -sedation plan fentanyl with PRN versed -if frequent versed pushes or vent dyssynchrony, may require NMB + versed infusion  Anemia -trend CBC  -transfuse per guidelines   Parkinson's Disease  Anxiety  -resume sinemet, lyrica, buspar  Hypothyroidism -continue synthroid PT  Bradycardia  HTN HLD -hold home lopressor  -continue ASA, lipitor  Best practice:  Diet: TF Pain/Anxiety/Delirium protocol (if indicated): yes VAP protocol (if indicated): yes DVT prophylaxis: lovenox per ICU protocol GI prophylaxis: famotidine Glucose control: SSI Mobility:BR Code Status: Full Code.  Confirmed code status with daughter 12/2.  She is HCPOA and patient had  requested CPR/full support.  Pt lost his wife in March 2020.  Family Communication: Daughter [name] [phone]) updated via phone 12/2.   Disposition: ICU  Labs   CBC: Recent Labs  Lab 01/07/2019 2002 01/19/2019 2051 01/07/19 0440  WBC 7.7  --  7.6  NEUTROABS 6.6  --   --   HGB 11.7* 12.2* 11.5*  HCT 37.3* 36.0* 37.3*  MCV 92.1  --  94.7  PLT 152  --  151    Basic Metabolic Panel: Recent Labs  Lab 01/07/2019 2002 01/08/2019 2051 01/07/19 0440  NA 140 138 141  K 5.2* 5.1 5.0  CL 109  --  110  CO2 21*  --  20*  GLUCOSE 99  --  96  BUN 42*  --  46*  CREATININE 1.12  --  1.00  CALCIUM 8.0*  --  8.0*  MG 2.3  --  2.3  PHOS 3.8  --  4.2   GFR: CrCl cannot be calculated (Unknown ideal weight.). Recent Labs  Lab 01/21/2019 2002 01/07/19 0440  PROCALCITON 2.43  --   WBC 7.7 7.6    Liver Function Tests: Recent Labs  Lab 01/13/2019 2002  AST 24  ALT 14  ALKPHOS 71  BILITOT 0.5  PROT 5.9*  ALBUMIN 2.6*   No results for input(s): LIPASE, AMYLASE in the last 168 hours. No results for input(s): AMMONIA in the last 168 hours.  ABG    Component Value Date/Time   PHART 7.346 (L) 01/19/2019 2051   PCO2ART 35.7 01/23/2019 2051   PO2ART 55.0 (L) 01/10/2019 2051   HCO3 19.5 (L) 01/07/2019 2051   TCO2 21 (L) 01/14/2019 2051   ACIDBASEDEF 5.0 (H) 02/01/2019 2051   O2SAT 87.0 01/29/2019 2051     Coagulation Profile: No results for input(s): INR, PROTIME in the last 168 hours.  Cardiac Enzymes: No results for input(s): CKTOTAL, CKMB, CKMBINDEX, TROPONINI in the last 168 hours.  HbA1C: No results found for: HGBA1C  CBG: Recent Labs  Lab 01/29/2019 2245 01/07/19 0018 01/07/19 0332  GLUCAP 93 109* 99      Critical care time: 35 minutes    Canary Brim, MSN, NP-C [facility] 01/07/2019, 8:04 AM   Please see [email or web address] for pager details.

## 2019-01-07 NOTE — TOC Initial Note (Signed)
Transition of Care (TOC) - Initial/Assessment Note    Patient Details  Name: Carl Nguyen. MRN: [id number] Date of Birth: [birth year]  Transition of Care [facility]) CM/SW Contact:    Ninfa Meeker, RN Phone Number: 01/07/2019, 4:08 PM  Clinical Narrative:  Patient is a  78 y/o male with baseline hypertension, parkinson's disease who presented to [facility] with increasing SOB.  Diagnosed with COVID on 11/23 and his daughter is positive as well.  Admitted to [facility] on 11/30 and treated with HFNC but O2 level dropped on 12/1 and he was intubated.  Patient transferred to [facility] on 12/1 for care of severe acute respiratory failure with hypoxemia decondary to  COVID PNA. Patient on Vent/intubated, fentanyl drip, Remdesivir until 12/4. Case manager will continue to monitor.            Patient Goals and CMS Choice        Expected Discharge Plan and Services                                                Prior Living Arrangements/Services                       Activities of Daily Living      Permission Sought/Granted                  Emotional Assessment              Admission diagnosis:  HYPOXIA COVID 19 VIRUS INFECTION Patient Active Problem List   Diagnosis Date Noted  . Bradycardia   . Parkinson's disease ([place])   . Acute respiratory distress syndrome (ARDS) due to COVID-19 virus ([place]) Jan 08, 2019   PCP:  Serita Grammes, MD Pharmacy:   [place], [place] - [address] [place] [address] Phone: [phone] Fax: [phone]     Social Determinants of Health (SDOH) Interventions    Readmission Risk Interventions No flowsheet data found.

## 2019-01-08 ENCOUNTER — Other Ambulatory Visit: Payer: Self-pay

## 2019-01-08 ENCOUNTER — Inpatient Hospital Stay (HOSPITAL_COMMUNITY): Payer: Medicare HMO

## 2019-01-08 DIAGNOSIS — J80 Acute respiratory distress syndrome: Secondary | ICD-10-CM | POA: Diagnosis not present

## 2019-01-08 DIAGNOSIS — U071 COVID-19: Secondary | ICD-10-CM | POA: Diagnosis not present

## 2019-01-08 DIAGNOSIS — G20 Parkinson's disease: Secondary | ICD-10-CM | POA: Diagnosis not present

## 2019-01-08 DIAGNOSIS — R001 Bradycardia, unspecified: Secondary | ICD-10-CM | POA: Diagnosis not present

## 2019-01-08 LAB — GLUCOSE, CAPILLARY
Glucose-Capillary: 120 mg/dL — ABNORMAL HIGH (ref 70–99)
Glucose-Capillary: 130 mg/dL — ABNORMAL HIGH (ref 70–99)
Glucose-Capillary: 141 mg/dL — ABNORMAL HIGH (ref 70–99)
Glucose-Capillary: 142 mg/dL — ABNORMAL HIGH (ref 70–99)
Glucose-Capillary: 153 mg/dL — ABNORMAL HIGH (ref 70–99)
Glucose-Capillary: 159 mg/dL — ABNORMAL HIGH (ref 70–99)
Glucose-Capillary: 163 mg/dL — ABNORMAL HIGH (ref 70–99)

## 2019-01-08 LAB — POCT I-STAT 7, (LYTES, BLD GAS, ICA,H+H)
Acid-base deficit: 6 mmol/L — ABNORMAL HIGH (ref 0.0–2.0)
Bicarbonate: 20.6 mmol/L (ref 20.0–28.0)
Calcium, Ion: 1.23 mmol/L (ref 1.15–1.40)
HCT: 29 % — ABNORMAL LOW (ref 39.0–52.0)
Hemoglobin: 9.9 g/dL — ABNORMAL LOW (ref 13.0–17.0)
O2 Saturation: 82 %
Patient temperature: 98.4
Potassium: 4.9 mmol/L (ref 3.5–5.1)
Sodium: 140 mmol/L (ref 135–145)
TCO2: 22 mmol/L (ref 22–32)
pCO2 arterial: 42.3 mmHg (ref 32.0–48.0)
pH, Arterial: 7.295 — ABNORMAL LOW (ref 7.350–7.450)
pO2, Arterial: 51 mmHg — ABNORMAL LOW (ref 83.0–108.0)

## 2019-01-08 LAB — BASIC METABOLIC PANEL
Anion gap: 8 (ref 5–15)
BUN: 48 mg/dL — ABNORMAL HIGH (ref 8–23)
CO2: 21 mmol/L — ABNORMAL LOW (ref 22–32)
Calcium: 7.6 mg/dL — ABNORMAL LOW (ref 8.9–10.3)
Chloride: 111 mmol/L (ref 98–111)
Creatinine, Ser: 0.88 mg/dL (ref 0.61–1.24)
GFR calc Af Amer: >60 mL/min (ref >60)
GFR calc non Af Amer: >60 mL/min (ref >60)
Glucose, Bld: 158 mg/dL — ABNORMAL HIGH (ref 70–99)
Potassium: 4.9 mmol/L (ref 3.5–5.1)
Sodium: 140 mmol/L (ref 135–145)

## 2019-01-08 LAB — HEPATIC FUNCTION PANEL
ALT: 9 U/L (ref 0–44)
AST: 14 U/L — ABNORMAL LOW (ref 15–41)
Albumin: 2 g/dL — ABNORMAL LOW (ref 3.5–5.0)
Alkaline Phosphatase: 61 U/L (ref 38–126)
Bilirubin, Direct: 0.2 mg/dL (ref 0.0–0.2)
Indirect Bilirubin: 0.2 mg/dL — ABNORMAL LOW (ref 0.3–0.9)
Total Bilirubin: 0.4 mg/dL (ref 0.3–1.2)
Total Protein: 4.5 g/dL — ABNORMAL LOW (ref 6.5–8.1)

## 2019-01-08 LAB — HEMOGLOBIN A1C
Hgb A1c MFr Bld: 6 % — ABNORMAL HIGH (ref 4.8–5.6)
Mean Plasma Glucose: 125.5 mg/dL

## 2019-01-08 LAB — PHOSPHORUS: Phosphorus: 3.1 mg/dL (ref 2.5–4.6)

## 2019-01-08 LAB — MAGNESIUM: Magnesium: 2.1 mg/dL (ref 1.7–2.4)

## 2019-01-08 MED ORDER — OXYCODONE HCL 5 MG PO TABS
5.0000 mg | ORAL_TABLET | Freq: Four times a day (QID) | ORAL | Status: DC
  Administered 2019-01-08 – 2019-01-12 (×17): 5 mg
  Filled 2019-01-08 (×16): qty 1

## 2019-01-08 MED ORDER — INSULIN ASPART 100 UNIT/ML ~~LOC~~ SOLN
0.0000 [IU] | SUBCUTANEOUS | Status: DC
  Administered 2019-01-08: 1 [IU] via SUBCUTANEOUS
  Administered 2019-01-08 (×3): 2 [IU] via SUBCUTANEOUS
  Administered 2019-01-08: 1 [IU] via SUBCUTANEOUS
  Administered 2019-01-08: 2 [IU] via SUBCUTANEOUS
  Administered 2019-01-09: 1 [IU] via SUBCUTANEOUS
  Administered 2019-01-09 – 2019-01-10 (×2): 2 [IU] via SUBCUTANEOUS
  Administered 2019-01-10 (×3): 1 [IU] via SUBCUTANEOUS
  Administered 2019-01-10 (×2): 2 [IU] via SUBCUTANEOUS
  Administered 2019-01-10 – 2019-01-11 (×2): 1 [IU] via SUBCUTANEOUS
  Administered 2019-01-11 (×4): 2 [IU] via SUBCUTANEOUS
  Administered 2019-01-12: 1 [IU] via SUBCUTANEOUS
  Administered 2019-01-12: 2 [IU] via SUBCUTANEOUS
  Administered 2019-01-12 – 2019-01-14 (×7): 1 [IU] via SUBCUTANEOUS
  Administered 2019-01-14 – 2019-01-15 (×5): 2 [IU] via SUBCUTANEOUS
  Administered 2019-01-15 – 2019-01-16 (×3): 3 [IU] via SUBCUTANEOUS

## 2019-01-08 NOTE — Progress Notes (Signed)
Update given to daughter. All questions were answered. Started this pt on insulin coverage.

## 2019-01-08 NOTE — Plan of Care (Signed)
Sinus brady at 40's to 50's but hemodynamically stable.  Vecuronium pushes given this shift foe ventilator desynchrony which drops pt's sats to 70's. Problem: Clinical Measurements: Goal: Ability to maintain clinical measurements within normal limits will improve Outcome: Progressing Goal: Will remain free from infection Outcome: Progressing Goal: Diagnostic test results will improve Outcome: Progressing Goal: Respiratory complications will improve Outcome: Progressing   Problem: Activity: Goal: Risk for activity intolerance will decrease Outcome: Progressing   Problem: Nutrition: Goal: Adequate nutrition will be maintained Outcome: Progressing   Problem: Elimination: Goal: Will not experience complications related to bowel motility Outcome: Progressing Goal: Will not experience complications related to urinary retention Outcome: Progressing   Problem: Pain Managment: Goal: General experience of comfort will improve Outcome: Progressing   Problem: Safety: Goal: Ability to remain free from injury will improve Outcome: Progressing   Problem: Skin Integrity: Goal: Risk for impaired skin integrity will decrease Outcome: Progressing

## 2019-01-08 NOTE — Progress Notes (Signed)
NAME:  Carl Nguyen., MRN:  [id number], DOB:  1940/04/28, LOS: 2 ADMISSION DATE:  02/05/2019, CONSULTATION DATE:  02/04/2019 REFERRING MD: [name] , CHIEF COMPLAINT:  dyspnea   Brief History   78 y/o male with baseline hypertension, parkinson's disease who presented to [facility] with increasing SOB.  Diagnosed with COVID on 11/23 and his daughter is positive as well.  Admitted to [facility] on 11/30 and treated with HFNC but O2 level dropped early am (daughter reports to the 30's) on 12/1 and he was intubated.  Patient transferred to [facility] on 12/1 for care of severe acute respiratory failure with hypoxemia in setting of COVID PNA.  Past Medical History  Hypertension Parkinson's disease Hypothyroidism Peripheral neuralgia R Knee Swelling - 2 weeks prior to admit had a tap of fluid   Significant Hospital Events   12/01 Admit to [facility]   Consults:    Procedures:  ETT 12/1 >>  Significant Diagnostic Tests:    Micro Data:  SARS COV 2 11/23 >> positive OSH  Antimicrobials/COVID Rx  Decadron 12/1 >> Remdesivir 12/1 >>  Interim history/subjective:  Afebrile. Remains bradycardic (baseline). I/O- UOP 1.7L in last 24h, 1.5L+ for 24h. Episode of vent dyssynchrony pm 12/2, versed gtt started, paralytics PRN.   Objective   Blood pressure (!) 111/54, pulse (!) 44, temperature 98.4 F (36.9 C), temperature source Oral, resp. rate (!) 24, height 5\' 7"  (1.702 m), weight 73.8 kg, SpO2 98 %.    Vent Mode: PRVC FiO2 (%):  [45 %-100 %] 45 % Set Rate:  [24 bmp] 24 bmp Vt Set:  [500 mL] 500 mL PEEP:  [12 cmH20] 12 cmH20 Plateau Pressure:  [20 cmH20-28 cmH20] 23 cmH20   Intake/Output Summary (Last 24 hours) at 01/08/2019 0758 Last data filed at 01/08/2019 0700 Gross per 24 hour  Intake 3273.23 ml  Output 1720 ml  Net 1553.23 ml   Filed Weights   01/28/2019 2000 01/07/19 0323 01/08/19 0359  Weight: 73.7 kg 73.7 kg 73.8 kg    Examination: General:thin elderly male  lying in bed on vent in NAD  HEENT: MM pink/moist, ETT, pupils 6mm reactive, anicteric, lids/lashes normal  Neuro: sedate on fentanyl/versed CV: s1s2 rrr, SB on monitor, no m/r/g PULM:  Non-labored, lungs bilaterally coarse without wheeze GI: soft, bsx4 active  Extremities: warm/dry, no edema  Skin: no rashes or lesions  Resolved Hospital Problem list     Assessment & Plan:   Severe Acute Respiratory Failure with Hypoxemia secondary to COVID-19 Pneumonia LFT's wnl, hepatitis B negative.  -low Vt ventilation 4-8cc/kg -goal plateau pressure <30, driving pressure 1m cm <74 -target PaO2 55-65, titrate PEEP/FiO2 per ARDS protocol  -if P/F ratio <150, consider prone therapy for 16 hours per day -goal CVP <4, diuresis as necessary -VAP prevention measures  -follow intermittent CXR, ABG  -decadron, remdesivir  AKI -Baseline sr cr ~ 1, improved -Trend BMP / urinary output -Replace electrolytes as indicated -Avoid nephrotoxic agents, ensure adequate renal perfusion  Need for sedation for mechanical ventilation -PAD protocol  -RASS Goal: -2 to -3 with vent synchrony  -fentanyl gtt + versed gtt for sedation  -PRN paralytics for vent synchrony   Anemia -trend CBC -transfuse per ICU guidelines   Parkinson's Disease  Anxiety  -continue lyrica, buspar, sinemet   Hypothyroidism -synthroid PT -check TSH  Bradycardia  HTN HLD -hold home lopressor  -continue ASA, lipitor  Best practice:  Diet: TF Pain/Anxiety/Delirium protocol (if indicated): yes VAP protocol (if indicated): yes  DVT prophylaxis: lovenox per ICU protocol GI prophylaxis: famotidine Glucose control: SSI Mobility:BR Code Status: Full Code.  Confirmed code status with daughter.  She is HCPOA and patient had requested CPR/full support.  Pt lost his wife in March 2020.  Family Communication: Daughter Helene Kelp [phone]) called for update, message left 12/3.  Disposition: ICU  Labs   CBC: Recent Labs  Lab  01/14/2019 2002 02/05/2019 2051 01/07/19 0440  WBC 7.7  --  7.6  NEUTROABS 6.6  --   --   HGB 11.7* 12.2* 11.5*  HCT 37.3* 36.0* 37.3*  MCV 92.1  --  94.7  PLT 152  --  220    Basic Metabolic Panel: Recent Labs  Lab 01/18/2019 2002 01/15/2019 2051 01/07/19 0440 01/07/19 1700 01/08/19 0415  NA 140 138 141  --  140  K 5.2* 5.1 5.0  --  4.9  CL 109  --  110  --  111  CO2 21*  --  20*  --  21*  GLUCOSE 99  --  96  --  158*  BUN 42*  --  46*  --  48*  CREATININE 1.12  --  1.00  --  0.88  CALCIUM 8.0*  --  8.0*  --  7.6*  MG 2.3  --  2.3 2.3 2.1  PHOS 3.8  --  4.2 4.0 3.1   GFR: Estimated Creatinine Clearance: 65.7 mL/min (by C-G formula based on SCr of 0.88 mg/dL). Recent Labs  Lab 01/13/2019 2002 01/07/19 0440  PROCALCITON 2.43  --   WBC 7.7 7.6    Liver Function Tests: Recent Labs  Lab 02/04/2019 2002  AST 24  ALT 14  ALKPHOS 71  BILITOT 0.5  PROT 5.9*  ALBUMIN 2.6*   No results for input(s): LIPASE, AMYLASE in the last 168 hours. No results for input(s): AMMONIA in the last 168 hours.  ABG    Component Value Date/Time   PHART 7.346 (L) 01/15/2019 2051   PCO2ART 35.7 01/31/2019 2051   PO2ART 55.0 (L) 01/09/2019 2051   HCO3 19.5 (L) 01/30/2019 2051   TCO2 21 (L) 02/04/2019 2051   ACIDBASEDEF 5.0 (H) 01/25/2019 2051   O2SAT 87.0 01/18/2019 2051     Coagulation Profile: No results for input(s): INR, PROTIME in the last 168 hours.  Cardiac Enzymes: No results for input(s): CKTOTAL, CKMB, CKMBINDEX, TROPONINI in the last 168 hours.  HbA1C: Hgb A1c MFr Bld  Date/Time Value Ref Range Status  01/08/2019 04:20 AM 6.0 (H) 4.8 - 5.6 % Final    Comment:    (NOTE) Pre diabetes:          5.7%-6.4% Diabetes:              >6.4% Glycemic control for   <7.0% adults with diabetes     CBG: Recent Labs  Lab 01/07/19 1241 01/07/19 1610 01/07/19 1956 01/07/19 2344 01/08/19 0357  GLUCAP 95 162* 150* 163* 141*      Critical care time: 30 minutes    Noe Gens, MSN, NP-C [facility] 01/08/2019, 7:58 AM   Please see [email or web address] for pager details.

## 2019-01-08 NOTE — Progress Notes (Signed)
Pt proned after having ET tube taped with cloth and protective dressings placed on cheeks, chin, upper l ip, forehead.  Tolerated well.

## 2019-01-09 ENCOUNTER — Inpatient Hospital Stay: Payer: Self-pay

## 2019-01-09 ENCOUNTER — Inpatient Hospital Stay (HOSPITAL_COMMUNITY): Payer: Medicare HMO

## 2019-01-09 DIAGNOSIS — G20 Parkinson's disease: Secondary | ICD-10-CM | POA: Diagnosis not present

## 2019-01-09 DIAGNOSIS — U071 COVID-19: Secondary | ICD-10-CM | POA: Diagnosis not present

## 2019-01-09 DIAGNOSIS — J80 Acute respiratory distress syndrome: Secondary | ICD-10-CM | POA: Diagnosis not present

## 2019-01-09 DIAGNOSIS — R001 Bradycardia, unspecified: Secondary | ICD-10-CM | POA: Diagnosis not present

## 2019-01-09 LAB — GLUCOSE, CAPILLARY
Glucose-Capillary: 115 mg/dL — ABNORMAL HIGH (ref 70–99)
Glucose-Capillary: 143 mg/dL — ABNORMAL HIGH (ref 70–99)
Glucose-Capillary: 148 mg/dL — ABNORMAL HIGH (ref 70–99)
Glucose-Capillary: 159 mg/dL — ABNORMAL HIGH (ref 70–99)
Glucose-Capillary: 98 mg/dL (ref 70–99)
Glucose-Capillary: 98 mg/dL (ref 70–99)

## 2019-01-09 LAB — POCT I-STAT 7, (LYTES, BLD GAS, ICA,H+H)
Acid-base deficit: 4 mmol/L — ABNORMAL HIGH (ref 0.0–2.0)
Acid-base deficit: 4 mmol/L — ABNORMAL HIGH (ref 0.0–2.0)
Bicarbonate: 21.9 mmol/L (ref 20.0–28.0)
Bicarbonate: 21.3 mmol/L (ref 20.0–28.0)
Calcium, Ion: 1.23 mmol/L (ref 1.15–1.40)
Calcium, Ion: 1.24 mmol/L (ref 1.15–1.40)
HCT: 29 % — ABNORMAL LOW (ref 39.0–52.0)
HCT: 29 % — ABNORMAL LOW (ref 39.0–52.0)
Hemoglobin: 9.9 g/dL — ABNORMAL LOW (ref 13.0–17.0)
Hemoglobin: 9.9 g/dL — ABNORMAL LOW (ref 13.0–17.0)
O2 Saturation: 95 %
O2 Saturation: 77 %
Patient temperature: 98.6
Patient temperature: 37
Potassium: 5.1 mmol/L (ref 3.5–5.1)
Potassium: 5 mmol/L (ref 3.5–5.1)
Sodium: 139 mmol/L (ref 135–145)
Sodium: 141 mmol/L (ref 135–145)
TCO2: 23 mmol/L (ref 22–32)
TCO2: 23 mmol/L (ref 22–32)
pCO2 arterial: 43.1 mmHg (ref 32.0–48.0)
pCO2 arterial: 40.7 mmHg (ref 32.0–48.0)
pH, Arterial: 7.315 — ABNORMAL LOW (ref 7.350–7.450)
pH, Arterial: 7.328 — ABNORMAL LOW (ref 7.350–7.450)
pO2, Arterial: 83 mmHg (ref 83.0–108.0)
pO2, Arterial: 45 mmHg — ABNORMAL LOW (ref 83.0–108.0)

## 2019-01-09 LAB — BASIC METABOLIC PANEL
Anion gap: 7 (ref 5–15)
BUN: 44 mg/dL — ABNORMAL HIGH (ref 8–23)
CO2: 23 mmol/L (ref 22–32)
Calcium: 7.6 mg/dL — ABNORMAL LOW (ref 8.9–10.3)
Chloride: 111 mmol/L (ref 98–111)
Creatinine, Ser: 0.85 mg/dL (ref 0.61–1.24)
GFR calc Af Amer: >60 mL/min (ref >60)
GFR calc non Af Amer: >60 mL/min (ref >60)
Glucose, Bld: 116 mg/dL — ABNORMAL HIGH (ref 70–99)
Potassium: 5.3 mmol/L — ABNORMAL HIGH (ref 3.5–5.1)
Sodium: 141 mmol/L (ref 135–145)

## 2019-01-09 LAB — CBC
HCT: 31.4 % — ABNORMAL LOW (ref 39.0–52.0)
Hemoglobin: 9.5 g/dL — ABNORMAL LOW (ref 13.0–17.0)
MCH: 29.1 pg (ref 26.0–34.0)
MCHC: 30.3 g/dL (ref 30.0–36.0)
MCV: 96 fL (ref 80.0–100.0)
Platelets: 168 10*3/uL (ref 150–400)
RBC: 3.27 MIL/uL — ABNORMAL LOW (ref 4.22–5.81)
RDW: 14.3 % (ref 11.5–15.5)
WBC: 9.6 10*3/uL (ref 4.0–10.5)
nRBC: 0 % (ref 0.0–0.2)

## 2019-01-09 LAB — T4, FREE: Free T4: 1.13 ng/dL — ABNORMAL HIGH (ref 0.61–1.12)

## 2019-01-09 LAB — TSH: TSH: 0.125 u[IU]/mL — ABNORMAL LOW (ref 0.350–4.500)

## 2019-01-09 MED ORDER — SODIUM CHLORIDE 0.9% FLUSH
10.0000 mL | Freq: Two times a day (BID) | INTRAVENOUS | Status: DC
  Administered 2019-01-09: 10 mL
  Administered 2019-01-09: 20 mL
  Administered 2019-01-10 – 2019-01-15 (×10): 10 mL

## 2019-01-09 MED ORDER — SODIUM CHLORIDE 0.9% FLUSH: 10.0000 mL | INTRAVENOUS | Status: DC | PRN

## 2019-01-09 MED ORDER — STERILE WATER FOR INJECTION IJ SOLN
INTRAMUSCULAR | Status: AC
  Administered 2019-01-09: 7.4 mL via INTRAVENOUS
  Filled 2019-01-09: qty 10

## 2019-01-09 MED ORDER — CHLORHEXIDINE GLUCONATE 0.12 % MT SOLN
15.0000 mL | OROMUCOSAL | Status: DC | PRN
  Filled 2019-01-09 (×3): qty 15

## 2019-01-09 MED ORDER — FUROSEMIDE 10 MG/ML IJ SOLN
40.0000 mg | Freq: Once | INTRAMUSCULAR | Status: AC
  Administered 2019-01-09: 40 mg via INTRAVENOUS
  Filled 2019-01-09: qty 4

## 2019-01-09 MED FILL — Sodium Chloride IV Soln 0.9%: INTRAVENOUS | Qty: 250 | Status: AC

## 2019-01-09 MED FILL — Fentanyl Citrate Preservative Free (PF) Inj 2500 MCG/50ML: INTRAMUSCULAR | Qty: 50 | Status: AC

## 2019-01-09 NOTE — Progress Notes (Signed)
Pt proned without any complications. 

## 2019-01-09 NOTE — Progress Notes (Signed)
Removed ring and bracelet from pt. Left at bedside in container

## 2019-01-09 NOTE — Progress Notes (Signed)
Attending:    Subjective: PF still really low TSH low synchyronous Prone overnight Plateau 19 cm H20  Objective: Vitals:   01/09/19 0600 01/09/19 0700 01/09/19 0725 01/09/19 0800  BP: (!) 112/52 (!) 113/54 (!) 113/54 (!) 116/53  Pulse: (!) 42 (!) 42 (!) 41 (!) 42  Resp:   (!) 24 (!) 24  Temp:    98.8 F (37.1 C)  TempSrc:    Oral  SpO2: 100% 100% 100% 100%  Weight:      Height:       Vent Mode: PRVC FiO2 (%):  [60 %] 60 % Set Rate:  [24 bmp] 24 bmp Vt Set:  [500 mL] 500 mL PEEP:  [12 cmH20-14 cmH20] 12 cmH20 Plateau Pressure:  [19 cmH20-22 cmH20] 19 cmH20  Intake/Output Summary (Last 24 hours) at 01/09/2019 1056 Last data filed at 01/09/2019 0800 Gross per 24 hour  Intake 1756.04 ml  Output 1775 ml  Net -18.96 ml    General:  In bed on vent HENT: NCAT ETT in place PULM: Crackles base B, vent supported breathing CV: RRR, no mgr GI: BS+, soft, nontender MSK: normal bulk and tone Neuro: sedated on vent   CBC    Component Value Date/Time   WBC 9.6 01/09/2019 0338   RBC 3.27 (L) 01/09/2019 0338   HGB 9.9 (L) 01/09/2019 1030   HCT 29.0 (L) 01/09/2019 1030   PLT 168 01/09/2019 0338   MCV 96.0 01/09/2019 0338   MCH 29.1 01/09/2019 0338   MCHC 30.3 01/09/2019 0338   RDW 14.3 01/09/2019 0338   LYMPHSABS 0.3 (L) 01/18/2019 2002   MONOABS 0.6 02/05/2019 2002   EOSABS 0.0 02/03/2019 2002   BASOSABS 0.0 01/30/2019 2002    BMET    Component Value Date/Time   NA 141 01/09/2019 1030   K 5.0 01/09/2019 1030   CL 111 01/09/2019 0338   CO2 23 01/09/2019 0338   GLUCOSE 116 (H) 01/09/2019 0338   BUN 44 (H) 01/09/2019 0338   CREATININE 0.85 01/09/2019 0338   CALCIUM 7.6 (L) 01/09/2019 0338   GFRNONAA >60 01/09/2019 0338   GFRAA >60 01/09/2019 0338    CXR images 12/4 reviewed bilateral interstitial edema  Impression/Plan: ARDS despite reasonable pulmonary pressures his oxygenation remains terrible, plan to  Prone again, no diuresis today, decadron to continue,  remdesivir complete Sedation needs: intermittent paralytic protocol, RASS target -4 to -5  Prognosis guarded, waiting game to see if he can outlast how long it takes for his lungs to heal  My cc time 35 minutes  Roselie Awkward, MD [facility] Pager: [phone] Cell: [phone] After 3pm or if no response, call [phone]

## 2019-01-09 NOTE — Progress Notes (Signed)
Pt's head turned at this time w/o complications. 

## 2019-01-09 NOTE — Progress Notes (Signed)
Spoke with patients daughter over phone and updated her on patients condition. All questions answered.

## 2019-01-09 NOTE — Progress Notes (Signed)
Spoke with pts POA Carl Nguyen and gave her updates. She stated she missed the Dr's call this AM and is requesting another call back tomorrow 12/4. All other questions and concerns were answered at this time.

## 2019-01-09 NOTE — Progress Notes (Signed)
Patient placed in supine position.  Small skin tear noticed on right ear lobe.  RN aware.  ETT resecured with commercial tube holder.  Patient tolerated well.

## 2019-01-09 NOTE — Progress Notes (Addendum)
NAME:  Carl Nguyen., MRN:  [id number], DOB:  1940/08/13, LOS: 3 ADMISSION DATE:  01/24/2019, CONSULTATION DATE:  01/20/2019 REFERRING MD: Venora Maples , CHIEF COMPLAINT:  dyspnea   Brief History   78 y/o male with baseline hypertension, parkinson's disease who presented to [facility] with increasing SOB.  Diagnosed with COVID on 11/23 and his daughter is positive as well.  Admitted to [facility] on 11/30 and treated with HFNC but O2 level dropped early am (daughter reports to the 42's) on 12/1 and he was intubated.  Patient transferred to [facility] on 12/1 for care of severe acute respiratory failure with hypoxemia in setting of COVID PNA.  Past Medical History  Hypertension Parkinson's disease Hypothyroidism Peripheral neuralgia R Knee Swelling - 2 weeks prior to admit had a tap of fluid   Significant Hospital Events   12/01 Admit to [place]:    Procedures:  ETT 12/1 >> RUE PICC 12/4 >>  Significant Diagnostic Tests:    Micro Data:  SARS COV 2 11/23 >> positive OSH  Antimicrobials/COVID Rx  Decadron 12/1 >> Remdesivir 12/1 >>  Interim history/subjective:  Afebrile.  I/O - UOP 1.8L in 24h, net 200+ in 24h.  Pt proned ~ 3pm 12/3.    Objective   Blood pressure (!) 113/54, pulse (!) 42, temperature (P) 98.8 F (37.1 C), temperature source (P) Oral, resp. rate (!) (P) 24, height 5\' 7"  (1.702 m), weight 78.3 kg, SpO2 100 %.    Vent Mode: PRVC FiO2 (%):  [60 %] (P) 60 % Set Rate:  [24 bmp] 24 bmp Vt Set:  [500 mL] 500 mL PEEP:  [12 cmH20-14 cmH20] 12 cmH20 Plateau Pressure:  [19 cmH20-22 cmH20] 19 cmH20   Intake/Output Summary (Last 24 hours) at 01/09/2019 0818 Last data filed at 01/09/2019 8413 Gross per 24 hour  Intake 1926.04 ml  Output 1695 ml  Net 231.04 ml   Filed Weights   01/07/19 0323 01/08/19 0359 01/09/19 0500  Weight: 73.7 kg 73.8 kg 78.3 kg    Examination: General: Thin, frail elderly male lying in bed in no acute distress HEENT:  MM pink/moist, ETT, small hematoma on right cheek, pupils 89mm, anicteric Neuro: Sedate on fentanyl/Versed CV: s1s2 RRR, SB 37-50's on monitor, no m/r/g PULM:  Non-labored on vent, lungs bilaterally clear, plateau pressure 19 GI: soft, bsx4 active, tol TF Extremities: warm/dry, trace facial edema  Skin: no rashes    Resolved Hospital Problem list     Assessment & Plan:   Severe Acute Respiratory Failure with Hypoxemia secondary to COVID-19 Pneumonia LFT's wnl, hepatitis B negative. -low Vt ventilation 4-8cc/kg -goal plateau pressure <30, driving pressure <24 cm H2O -target PaO2 55-65, titrate PEEP/FiO2 per ARDS protocol  -if P/F ratio <150, consider prone therapy for 16 hours per day -goal CVP <4, diuresis as necessary -VAP prevention measures  -follow intermittent CXR  -continue decadron, remdesivir -lasix 40 mg IV x1   AKI -Baseline sr cr ~ 1, improved -Trend BMP / urinary output -Replace electrolytes as indicated -Avoid nephrotoxic agents, ensure adequate renal perfusion  Need for sedation for mechanical ventilation -PAD protocol  -RASS Goal -4 to -5 with PRN NMB -fentanyl, versed, PRN vecuronium for vent synchrony   Anemia -Trend CBC -transfuse for Hgb <7%  Parkinson's Disease  Anxiety  -continue sinemet, lyrica, buspar  Hypothyroidism -continue current synthroid dosing   Bradycardia  HTN HLD -hold home lopressor  -ASA + lipitor   Best practice:  Diet: TF Pain/Anxiety/Delirium  protocol (if indicated): yes VAP protocol (if indicated): yes DVT prophylaxis: lovenox 40 mg BID GI prophylaxis: famotidine Glucose control: SSI Mobility:BR Code Status: Full Code.  Confirmed code status with daughter.  She is HCPOA and patient had requested CPR/full support.  Pt lost his wife in March 2020.  Family Communication: Daughter [name] [phone]) called for update 12/4.  Reviewed nature of critical illness and advanced age and increased mortality risks.  She  indicates understanding and states "I'm trying to be realistic about this but also hopeful".  Support offered.  Disposition: ICU  Labs   CBC: Recent Labs  Lab 01/12/2019 2002 01/26/2019 2051 01/07/19 0440 01/08/19 0842 01/09/19 0323 01/09/19 0338  WBC 7.7  --  7.6  --   --  9.6  NEUTROABS 6.6  --   --   --   --   --   HGB 11.7* 12.2* 11.5* 9.9* 9.9* 9.5*  HCT 37.3* 36.0* 37.3* 29.0* 29.0* 31.4*  MCV 92.1  --  94.7  --   --  96.0  PLT 152  --  151  --   --  168    Basic Metabolic Panel: Recent Labs  Lab 02/03/2019 2002  01/07/19 0440 01/07/19 1700 01/08/19 0415 01/08/19 0842 01/09/19 0323 01/09/19 0338  NA 140   < > 141  --  140 140 139 141  K 5.2*   < > 5.0  --  4.9 4.9 5.1 5.3*  CL 109  --  110  --  111  --   --  111  CO2 21*  --  20*  --  21*  --   --  23  GLUCOSE 99  --  96  --  158*  --   --  116*  BUN 42*  --  46*  --  48*  --   --  44*  CREATININE 1.12  --  1.00  --  0.88  --   --  0.85  CALCIUM 8.0*  --  8.0*  --  7.6*  --   --  7.6*  MG 2.3  --  2.3 2.3 2.1  --   --   --   PHOS 3.8  --  4.2 4.0 3.1  --   --   --    < > = values in this interval not displayed.   GFR: Estimated Creatinine Clearance: 68 mL/min (by C-G formula based on SCr of 0.85 mg/dL). Recent Labs  Lab 01/22/2019 2002 01/07/19 0440 01/09/19 0338  PROCALCITON 2.43  --   --   WBC 7.7 7.6 9.6    Liver Function Tests: Recent Labs  Lab 01/24/2019 2002 01/08/19 0415  AST 24 14*  ALT 14 9  ALKPHOS 71 61  BILITOT 0.5 0.4  PROT 5.9* 4.5*  ALBUMIN 2.6* 2.0*   No results for input(s): LIPASE, AMYLASE in the last 168 hours. No results for input(s): AMMONIA in the last 168 hours.  ABG    Component Value Date/Time   PHART 7.315 (L) 01/09/2019 0323   PCO2ART 43.1 01/09/2019 0323   PO2ART 83.0 01/09/2019 0323   HCO3 21.9 01/09/2019 0323   TCO2 23 01/09/2019 0323   ACIDBASEDEF 4.0 (H) 01/09/2019 0323   O2SAT 95.0 01/09/2019 0323     Coagulation Profile: No results for input(s): INR,  PROTIME in the last 168 hours.  Cardiac Enzymes: No results for input(s): CKTOTAL, CKMB, CKMBINDEX, TROPONINI in the last 168 hours.  HbA1C: Hgb A1c MFr Bld  Date/Time Value  Ref Range Status  01/08/2019 04:20 AM 6.0 (H) 4.8 - 5.6 % Final    Comment:    (NOTE) Pre diabetes:          5.7%-6.4% Diabetes:              >6.4% Glycemic control for   <7.0% adults with diabetes     CBG: Recent Labs  Lab 01/08/19 1314 01/08/19 1546 01/08/19 2114 01/08/19 2331 01/09/19 0404  GLUCAP 120* 130* 159* 153* 98      Critical care time: 30 minutes    Canary BrimBrandi Trenice Mesa, MSN, NP-C [facility] 01/09/2019, 8:18 AM   Please see [email or web address] for pager details.

## 2019-01-10 DIAGNOSIS — J80 Acute respiratory distress syndrome: Secondary | ICD-10-CM | POA: Diagnosis not present

## 2019-01-10 DIAGNOSIS — U071 COVID-19: Secondary | ICD-10-CM | POA: Diagnosis not present

## 2019-01-10 LAB — CBC
HCT: 31.8 % — ABNORMAL LOW (ref 39.0–52.0)
Hemoglobin: 9.6 g/dL — ABNORMAL LOW (ref 13.0–17.0)
MCH: 28.6 pg (ref 26.0–34.0)
MCHC: 30.2 g/dL (ref 30.0–36.0)
MCV: 94.6 fL (ref 80.0–100.0)
Platelets: 165 10*3/uL (ref 150–400)
RBC: 3.36 MIL/uL — ABNORMAL LOW (ref 4.22–5.81)
RDW: 14.2 % (ref 11.5–15.5)
WBC: 8.9 10*3/uL (ref 4.0–10.5)
nRBC: 0 % (ref 0.0–0.2)

## 2019-01-10 LAB — POCT I-STAT 7, (LYTES, BLD GAS, ICA,H+H)
Acid-base deficit: 2 mmol/L (ref 0.0–2.0)
Acid-base deficit: 3 mmol/L — ABNORMAL HIGH (ref 0.0–2.0)
Bicarbonate: 23.9 mmol/L (ref 20.0–28.0)
Bicarbonate: 23.6 mmol/L (ref 20.0–28.0)
Calcium, Ion: 1.21 mmol/L (ref 1.15–1.40)
Calcium, Ion: 1.21 mmol/L (ref 1.15–1.40)
HCT: 27 % — ABNORMAL LOW (ref 39.0–52.0)
HCT: 28 % — ABNORMAL LOW (ref 39.0–52.0)
Hemoglobin: 9.2 g/dL — ABNORMAL LOW (ref 13.0–17.0)
Hemoglobin: 9.5 g/dL — ABNORMAL LOW (ref 13.0–17.0)
O2 Saturation: 98 %
O2 Saturation: 88 %
Patient temperature: 98.6
Patient temperature: 36.9
Potassium: 5.4 mmol/L — ABNORMAL HIGH (ref 3.5–5.1)
Potassium: 5.3 mmol/L — ABNORMAL HIGH (ref 3.5–5.1)
Sodium: 139 mmol/L (ref 135–145)
Sodium: 138 mmol/L (ref 135–145)
TCO2: 25 mmol/L (ref 22–32)
TCO2: 25 mmol/L (ref 22–32)
pCO2 arterial: 46.5 mmHg (ref 32.0–48.0)
pCO2 arterial: 48.7 mmHg — ABNORMAL HIGH (ref 32.0–48.0)
pH, Arterial: 7.319 — ABNORMAL LOW (ref 7.350–7.450)
pH, Arterial: 7.292 — ABNORMAL LOW (ref 7.350–7.450)
pO2, Arterial: 118 mmHg — ABNORMAL HIGH (ref 83.0–108.0)
pO2, Arterial: 62 mmHg — ABNORMAL LOW (ref 83.0–108.0)

## 2019-01-10 LAB — BASIC METABOLIC PANEL
Anion gap: 9 (ref 5–15)
BUN: 45 mg/dL — ABNORMAL HIGH (ref 8–23)
CO2: 24 mmol/L (ref 22–32)
Calcium: 7.5 mg/dL — ABNORMAL LOW (ref 8.9–10.3)
Chloride: 108 mmol/L (ref 98–111)
Creatinine, Ser: 0.93 mg/dL (ref 0.61–1.24)
GFR calc Af Amer: >60 mL/min (ref >60)
GFR calc non Af Amer: >60 mL/min (ref >60)
Glucose, Bld: 160 mg/dL — ABNORMAL HIGH (ref 70–99)
Potassium: 5.5 mmol/L — ABNORMAL HIGH (ref 3.5–5.1)
Sodium: 141 mmol/L (ref 135–145)

## 2019-01-10 LAB — GLUCOSE, CAPILLARY
Glucose-Capillary: 157 mg/dL — ABNORMAL HIGH (ref 70–99)
Glucose-Capillary: 143 mg/dL — ABNORMAL HIGH (ref 70–99)
Glucose-Capillary: 141 mg/dL — ABNORMAL HIGH (ref 70–99)
Glucose-Capillary: 180 mg/dL — ABNORMAL HIGH (ref 70–99)
Glucose-Capillary: 183 mg/dL — ABNORMAL HIGH (ref 70–99)

## 2019-01-10 MED ORDER — ATROPINE SULFATE 1 MG/10ML IJ SOSY
PREFILLED_SYRINGE | INTRAMUSCULAR | Status: AC
  Filled 2019-01-10: qty 10

## 2019-01-10 MED ORDER — FAMOTIDINE 40 MG/5ML PO SUSR
20.0000 mg | Freq: Two times a day (BID) | ORAL | Status: DC
  Administered 2019-01-10 – 2019-01-15 (×11): 20 mg
  Filled 2019-01-10 (×11): qty 2.5

## 2019-01-10 MED ORDER — ACETAMINOPHEN 325 MG PO TABS: 650.0000 mg | ORAL_TABLET | ORAL | Status: DC | PRN

## 2019-01-10 MED ORDER — FUROSEMIDE 10 MG/ML IJ SOLN
40.0000 mg | Freq: Once | INTRAMUSCULAR | Status: AC
  Administered 2019-01-10: 40 mg via INTRAVENOUS
  Filled 2019-01-10: qty 4

## 2019-01-10 MED ORDER — SODIUM ZIRCONIUM CYCLOSILICATE 10 G PO PACK
10.0000 g | PACK | Freq: Two times a day (BID) | ORAL | Status: AC
  Administered 2019-01-10 (×2): 10 g via ORAL
  Filled 2019-01-10 (×2): qty 1

## 2019-01-10 NOTE — Progress Notes (Signed)
Sent to security ,1cellphone,1 wallet with 4/1$ bills,3/50 $ bills,s/s card,Driver [employer] card, insurance card,[employer] gift card,

## 2019-01-10 NOTE — Progress Notes (Signed)
Patient placed in supine position by RT x 2 and RN x 4 without complications.  ETT re-secured with a commercial tube holder at 25 on the right.

## 2019-01-10 NOTE — Progress Notes (Signed)
NAME:  Carl Nguyen., MRN:  [id number], DOB:  11/12/1940, LOS: 4 ADMISSION DATE:  01/18/2019, CONSULTATION DATE:  01/14/2019 REFERRING MD: Venora Maples , CHIEF COMPLAINT:  dyspnea   Brief History   78 y/o male with baseline hypertension, parkinson's disease who presented to [facility] with increasing SOB.  Diagnosed with COVID on 11/23 and his daughter is positive as well.  Admitted to [facility] on 11/30 and treated with HFNC but O2 level dropped early am (daughter reports to the 63's) on 12/1 and he was intubated.  Patient transferred to [facility] on 12/1 for care of severe acute respiratory failure with hypoxemia in setting of COVID PNA.  Past Medical History  Hypertension Parkinson's disease Hypothyroidism Peripheral neuralgia R Knee Swelling - 2 weeks prior to admit had a tap of fluid   Significant Hospital Events   12/01 Admit to [facility]  12/03 start prone positioning  Consults:    Procedures:  ETT 12/1 >> RUE PICC 12/4 >>  Significant Diagnostic Tests:    Micro Data:  SARS COV 2 11/23 >> positive OSH  Antimicrobials/COVID Rx  Decadron 12/1 >> Remdesivir 12/1 >>  Interim history/subjective:  Remains on prone positioning.  PaO2:FiO2 147 this AM.  Objective   Blood pressure (!) 106/53, pulse (!) 38, temperature 98.8 F (37.1 C), temperature source Oral, resp. rate (!) 24, height 5\' 7"  (1.702 m), weight 78.1 kg, SpO2 100 %.    Vent Mode: PRVC FiO2 (%):  [60 %-80 %] 80 % Set Rate:  [24 bmp] 24 bmp Vt Set:  [500 mL] 500 mL PEEP:  [12 cmH20] 12 cmH20 Plateau Pressure:  [22 cmH20-35 cmH20] 23 cmH20   Intake/Output Summary (Last 24 hours) at 01/10/2019 1021 Last data filed at 01/10/2019 0800 Gross per 24 hour  Intake 1574.13 ml  Output 2125 ml  Net -550.87 ml   Filed Weights   01/08/19 0359 01/09/19 0500 01/10/19 0400  Weight: 73.8 kg 78.3 kg 78.1 kg    Examination:  General - sedated Eyes - pupils reactive ENT - ETT in place Cardiac - auscultated  posteriorly, decreased heart sounds, no murmur Chest - decreased breath sounds, scattered rhonchi, no wheeze Abdomen - unable to assess as pt in prone position Extremities - no edema Skin - no rashes Neuro - RASS -4  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 - day 5/5 of remdesivir - day 5/10 of decadron - goal SpO2 88 to 95% - f/u CXR, ABG - if PaO2:FiO2 < 150, then continue proning - negative fluid balance as able - continue vitamin C, zinc  Acute metabolic encephalopathy. Hx of Parkinson disease, anxiety. - goal RASS -4 to -5 - prn neuromuscular blockade for vent synchrony - continue sinemet, buspar, celexa, oxycodone, lyrica  Anemia of critical illness. - f/u CBC - transfuse for Hb < 7 or significant bleeding  Hypothyroidism -continue current synthroid dosing   Sinus bradycardia. - hold outpt lopressor - monitor hemodynamics  Hx of HTN, HLD. - continue ASA, crestor  Steroid induced hyperglycemia. - SSI  Best practice:  Diet: TF DVT prophylaxis: lovenox  GI prophylaxis: famotidine Mobility:BR Code Status: Full Code Disposition: ICU  Labs    CMP Latest Ref Rng & Units 01/10/2019 01/10/2019 01/09/2019  Glucose 70 - 99 mg/dL - 160(H) -  BUN 8 - 23 mg/dL - 45(H) -  Creatinine 0.61 - 1.24 mg/dL - 6.72 -  Sodium 094 - 145 mmol/L 139 141 141  Potassium 3.5 - 5.1 mmol/L 5.4(H) 5.5(H) 5.0  Chloride 98 - 111 mmol/L - 108 -  CO2 22 - 32 mmol/L - 24 -  Calcium 8.9 - 10.3 mg/dL - 7.5(L) -  Total Protein 6.5 - 8.1 g/dL - - -  Total Bilirubin 0.3 - 1.2 mg/dL - - -  Alkaline Phos 38 - 126 U/L - - -  AST 15 - 41 U/L - - -  ALT 0 - 44 U/L - - -    CBC Latest Ref Rng & Units 01/10/2019 01/10/2019 01/09/2019  WBC 4.0 - 10.5 K/uL - 8.9 -  Hemoglobin 13.0 - 17.0 g/dL 7.0(J) 6.2(E) 3.6(O)  Hematocrit 39.0 - 52.0 % 27.0(L) 31.8(L) 29.0(L)   Platelets 150 - 400 K/uL - 165 -    ABG    Component Value Date/Time   PHART 7.319 (L) 01/10/2019 0553   PCO2ART 46.5 01/10/2019 0553   PO2ART 118.0 (H) 01/10/2019 0553   HCO3 23.9 01/10/2019 0553   TCO2 25 01/10/2019 0553   ACIDBASEDEF 2.0 01/10/2019 0553   O2SAT 98.0 01/10/2019 0553    CBG (last 3)  Recent Labs    01/09/19 2342 01/10/19 0413 01/10/19 0808  GLUCAP 143* 157* 143*    CC time 33 minutes  Coralyn Helling, MD [facility] 01/10/2019, 10:34 AM

## 2019-01-10 NOTE — Progress Notes (Signed)
Pt's head turned at this time w/o complication 

## 2019-01-10 NOTE — Progress Notes (Signed)
Nose bleed noted.

## 2019-01-10 NOTE — Progress Notes (Signed)
Pt ETT holister removed and ETT re-secured at 25cm at the center with cloth tape. Prior to placing pt prone small cut to right lower lip noted with small amount of bleeding. RN notified. Pt proned with RT x2 and RN x3. RT will continue to monitor.

## 2019-01-10 NOTE — Progress Notes (Signed)
Spoke with wife,updated on husbands condition

## 2019-01-10 NOTE — Progress Notes (Signed)
Spoke with his daughter, Helene Kelp, over the phone.  Updated about current status and treatment plan.  Explained his FiO2 has improved some with prone positioning, but he is still needing significant vent support.  Explained still to early to determine prognosis, but if he continues on current trajectory then cautiously optimistic about recovery.  Chesley Mires, MD [facility]/Critical Care 01/10/2019, 10:46 AM

## 2019-01-11 ENCOUNTER — Inpatient Hospital Stay (HOSPITAL_COMMUNITY): Payer: Medicare HMO

## 2019-01-11 DIAGNOSIS — U071 COVID-19: Secondary | ICD-10-CM | POA: Diagnosis not present

## 2019-01-11 DIAGNOSIS — J80 Acute respiratory distress syndrome: Secondary | ICD-10-CM | POA: Diagnosis not present

## 2019-01-11 LAB — CBC
HCT: 31.9 % — ABNORMAL LOW (ref 39.0–52.0)
Hemoglobin: 9.6 g/dL — ABNORMAL LOW (ref 13.0–17.0)
MCH: 28.6 pg (ref 26.0–34.0)
MCHC: 30.1 g/dL (ref 30.0–36.0)
MCV: 94.9 fL (ref 80.0–100.0)
Platelets: 178 10*3/uL (ref 150–400)
RBC: 3.36 MIL/uL — ABNORMAL LOW (ref 4.22–5.81)
RDW: 14.2 % (ref 11.5–15.5)
WBC: 7.9 10*3/uL (ref 4.0–10.5)
nRBC: 0 % (ref 0.0–0.2)

## 2019-01-11 LAB — BASIC METABOLIC PANEL
Anion gap: 8 (ref 5–15)
BUN: 44 mg/dL — ABNORMAL HIGH (ref 8–23)
CO2: 27 mmol/L (ref 22–32)
Calcium: 7.6 mg/dL — ABNORMAL LOW (ref 8.9–10.3)
Chloride: 107 mmol/L (ref 98–111)
Creatinine, Ser: 0.89 mg/dL (ref 0.61–1.24)
GFR calc Af Amer: >60 mL/min (ref >60)
GFR calc non Af Amer: >60 mL/min (ref >60)
Glucose, Bld: 177 mg/dL — ABNORMAL HIGH (ref 70–99)
Potassium: 5.4 mmol/L — ABNORMAL HIGH (ref 3.5–5.1)
Sodium: 142 mmol/L (ref 135–145)

## 2019-01-11 LAB — POCT I-STAT 7, (LYTES, BLD GAS, ICA,H+H)
Bicarbonate: 26.3 mmol/L (ref 20.0–28.0)
Calcium, Ion: 1.2 mmol/L (ref 1.15–1.40)
HCT: 29 % — ABNORMAL LOW (ref 39.0–52.0)
Hemoglobin: 9.9 g/dL — ABNORMAL LOW (ref 13.0–17.0)
O2 Saturation: 96 %
Patient temperature: 36.2
Potassium: 5 mmol/L (ref 3.5–5.1)
Sodium: 138 mmol/L (ref 135–145)
TCO2: 28 mmol/L (ref 22–32)
pCO2 arterial: 46.8 mmHg (ref 32.0–48.0)
pH, Arterial: 7.354 (ref 7.350–7.450)
pO2, Arterial: 82 mmHg — ABNORMAL LOW (ref 83.0–108.0)

## 2019-01-11 LAB — GLUCOSE, CAPILLARY
Glucose-Capillary: 142 mg/dL — ABNORMAL HIGH (ref 70–99)
Glucose-Capillary: 146 mg/dL — ABNORMAL HIGH (ref 70–99)
Glucose-Capillary: 152 mg/dL — ABNORMAL HIGH (ref 70–99)
Glucose-Capillary: 158 mg/dL — ABNORMAL HIGH (ref 70–99)
Glucose-Capillary: 160 mg/dL — ABNORMAL HIGH (ref 70–99)
Glucose-Capillary: 161 mg/dL — ABNORMAL HIGH (ref 70–99)

## 2019-01-11 MED ORDER — FUROSEMIDE 10 MG/ML IJ SOLN
40.0000 mg | Freq: Once | INTRAMUSCULAR | Status: AC
  Administered 2019-01-11: 40 mg via INTRAVENOUS
  Filled 2019-01-11: qty 4

## 2019-01-11 NOTE — Progress Notes (Signed)
Tried calling pt's daughter at listed number.  No answer.  Chesley Mires, MD [facility]/Critical [facility] 01/11/2019, 1:49 PM

## 2019-01-11 NOTE — Progress Notes (Signed)
Pt's head turned at this time without complication.

## 2019-01-11 NOTE — Progress Notes (Signed)
NAME:  Carl Kopera., MRN:  [id number], DOB:  11/01/1940, LOS: 5 ADMISSION DATE:  02/02/2019, CONSULTATION DATE:  01/29/2019 REFERRING MD: [name] , CHIEF COMPLAINT:  dyspnea   Brief History   78 y/o male with baseline hypertension, parkinson's disease who presented to [facility] with increasing SOB.  Diagnosed with COVID on 11/23 and his daughter is positive as well.  Admitted to [facility] on 11/30 and treated with HFNC but O2 level dropped early am (daughter reports to the 30's) on 12/1 and he was intubated.  Patient transferred to [facility] on 12/1 for care of severe acute respiratory failure with hypoxemia in setting of COVID PNA.  Past Medical History  Hypertension Parkinson's disease Hypothyroidism Peripheral neuralgia R Knee Swelling - 2 weeks prior to admit had a tap of fluid   Significant Hospital Events   12/01 Admit to [facility]  12/03 start prone positioning  Consults:    Procedures:  ETT 12/1 >> RUE PICC 12/4 >>  Significant Diagnostic Tests:    Micro Data:  SARS COV 2 11/23 >> positive OSH  Antimicrobials/COVID Rx  Decadron 12/1 >> Remdesivir 12/1 >> 12/05  Interim history/subjective:  Prone positioning.  PaO2:FiO2 77.5.  On 80% FiO2, PEEP 12.  Objective   Blood pressure (!) 115/52, pulse (!) 39, temperature (!) 97.5 F (36.4 C), resp. rate (!) 24, height 5\' 7"  (1.702 m), weight 78.1 kg, SpO2 99 %.    Vent Mode: PRVC FiO2 (%):  [80 %] 80 % Set Rate:  [24 bmp] 24 bmp Vt Set:  [500 mL] 500 mL PEEP:  [12 cmH20] 12 cmH20 Plateau Pressure:  [20 cmH20-22 cmH20] 20 cmH20   Intake/Output Summary (Last 24 hours) at 01/11/2019 0855 Last data filed at 01/11/2019 0600 Gross per 24 hour  Intake 1623.22 ml  Output 2150 ml  Net -526.78 ml   Filed Weights   01/08/19 0359 01/09/19 0500 01/10/19 0400  Weight: 73.8 kg 78.3 kg 78.1 kg    Examination:  General - sedated Eyes - pupils reactive ENT - ETT in place Cardiac - regular, bradycardic Chest  - b/l rhonchi Abdomen - + bowel sounds, prone positioning Extremities - 1+ edema Skin - no rashes Neuro - RASS -4  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 - completed remdesivir - day 6/10 of decadron - prone positioning if PaO2:FiO2 < 150 - negative fluid balance - f/u D dimer, CRP  Acute metabolic encephalopathy. Hx of Parkinson disease, anxiety. - RASS goal -4 to -5 - prn NMB for vent synchrony - continue sinemet, buspar, celexa, oxycodone, lyrica  Anemia of critical illness. - f/u CBC - transfuse for Hb < 7 or significant bleeding  Hyperkalemia. - f/u BMET  Hypothyroidism - continue synthroid  Sinus bradycardia. - hold outpt lopressor - monitor hemodynamics  Hx of HTN, HLD. - continue ASA, crestor  Steroid induced hyperglycemia. - SSI  Best practice:  Diet: TF DVT prophylaxis: lovenox  GI prophylaxis: famotidine Mobility:BR Code Status: Full Code Disposition: ICU  Labs    CMP Latest Ref Rng & Units 01/11/2019 01/10/2019 01/10/2019  Glucose 70 - 99 mg/dL 14/06/2018) - -  BUN 8 - 23 mg/dL 381(W) - -  Creatinine 29(H - 1.24 mg/dL 3.71 - -  Sodium 6.96 - 145 mmol/L 142 138 139  Potassium 3.5 - 5.1 mmol/L 5.4(H) 5.3(H)  5.4(H)  Chloride 98 - 111 mmol/L 107 - -  CO2 22 - 32 mmol/L 27 - -  Calcium 8.9 - 10.3 mg/dL 7.6(L) - -  Total Protein 6.5 - 8.1 g/dL - - -  Total Bilirubin 0.3 - 1.2 mg/dL - - -  Alkaline Phos 38 - 126 U/L - - -  AST 15 - 41 U/L - - -  ALT 0 - 44 U/L - - -    CBC Latest Ref Rng & Units 01/11/2019 01/10/2019 01/10/2019  WBC 4.0 - 10.5 K/uL 7.9 - -  Hemoglobin 13.0 - 17.0 g/dL 9.6(L) 9.5(L) 9.2(L)  Hematocrit 39.0 - 52.0 % 31.9(L) 28.0(L) 27.0(L)  Platelets 150 - 400 K/uL 178 - -    ABG    Component Value Date/Time   PHART 7.292 (L) 01/10/2019 1212   PCO2ART 48.7 (H) 01/10/2019 1212   PO2ART 62.0 (L)  01/10/2019 1212   HCO3 23.6 01/10/2019 1212   TCO2 25 01/10/2019 1212   ACIDBASEDEF 3.0 (H) 01/10/2019 1212   O2SAT 88.0 01/10/2019 1212    CBG (last 3)  Recent Labs    01/10/19 1934 01/10/19 2348 01/11/19 0804  GLUCAP 183* 146* 160*    CC time 32 minutes  Chesley Mires, MD [facility] Pulmonary/Critical Care 01/11/2019, 8:55 AM

## 2019-01-11 NOTE — Progress Notes (Signed)
Assisted tele visit to patient with family member.  Dayna Geurts P, RN  

## 2019-01-11 NOTE — Progress Notes (Signed)
Patient placed in supine position by RT x 1 and RN x 4 without complications.  ETT re-secured by a commercial tube holder at 25cm in the center.    Due to facial swelling, CCM agreed to not re-prone the patient today. We will reassess in the morning.

## 2019-01-11 NOTE — Progress Notes (Signed)
Significant facial swelling.  Will hold off on additional prone positioning for today.  Chesley Mires, MD [facility] Pulmonary/Critical Care 01/11/2019, 11:24 AM

## 2019-01-11 NOTE — Progress Notes (Signed)
Patient's daughter called for update. Update given. Requested video call. Set up with elink and video call provided. No other needs at this time.

## 2019-01-12 ENCOUNTER — Inpatient Hospital Stay (HOSPITAL_COMMUNITY): Payer: Medicare HMO

## 2019-01-12 DIAGNOSIS — J80 Acute respiratory distress syndrome: Secondary | ICD-10-CM | POA: Diagnosis not present

## 2019-01-12 DIAGNOSIS — R001 Bradycardia, unspecified: Secondary | ICD-10-CM | POA: Diagnosis not present

## 2019-01-12 DIAGNOSIS — G20 Parkinson's disease: Secondary | ICD-10-CM | POA: Diagnosis not present

## 2019-01-12 DIAGNOSIS — U071 COVID-19: Secondary | ICD-10-CM | POA: Diagnosis not present

## 2019-01-12 LAB — BASIC METABOLIC PANEL
Anion gap: 8 (ref 5–15)
BUN: 48 mg/dL — ABNORMAL HIGH (ref 8–23)
CO2: 28 mmol/L (ref 22–32)
Calcium: 7.5 mg/dL — ABNORMAL LOW (ref 8.9–10.3)
Chloride: 106 mmol/L (ref 98–111)
Creatinine, Ser: 0.93 mg/dL (ref 0.61–1.24)
GFR calc Af Amer: >60 mL/min (ref >60)
GFR calc non Af Amer: >60 mL/min (ref >60)
Glucose, Bld: 154 mg/dL — ABNORMAL HIGH (ref 70–99)
Potassium: 5.4 mmol/L — ABNORMAL HIGH (ref 3.5–5.1)
Sodium: 142 mmol/L (ref 135–145)

## 2019-01-12 LAB — CBC
HCT: 32.7 % — ABNORMAL LOW (ref 39.0–52.0)
Hemoglobin: 9.9 g/dL — ABNORMAL LOW (ref 13.0–17.0)
MCH: 28.9 pg (ref 26.0–34.0)
MCHC: 30.3 g/dL (ref 30.0–36.0)
MCV: 95.6 fL (ref 80.0–100.0)
Platelets: 214 10*3/uL (ref 150–400)
RBC: 3.42 MIL/uL — ABNORMAL LOW (ref 4.22–5.81)
RDW: 14.3 % (ref 11.5–15.5)
WBC: 8.2 10*3/uL (ref 4.0–10.5)
nRBC: 0 % (ref 0.0–0.2)

## 2019-01-12 LAB — GLUCOSE, CAPILLARY
Glucose-Capillary: 145 mg/dL — ABNORMAL HIGH (ref 70–99)
Glucose-Capillary: 139 mg/dL — ABNORMAL HIGH (ref 70–99)
Glucose-Capillary: 147 mg/dL — ABNORMAL HIGH (ref 70–99)
Glucose-Capillary: 93 mg/dL (ref 70–99)
Glucose-Capillary: 89 mg/dL (ref 70–99)
Glucose-Capillary: 106 mg/dL — ABNORMAL HIGH (ref 70–99)
Glucose-Capillary: 79 mg/dL (ref 70–99)

## 2019-01-12 LAB — D-DIMER, QUANTITATIVE: D-Dimer, Quant: 3.75 ug/mL-FEU — ABNORMAL HIGH (ref 0.00–0.50)

## 2019-01-12 LAB — C-REACTIVE PROTEIN: CRP: 5 mg/dL — ABNORMAL HIGH (ref <1.0)

## 2019-01-12 MED ORDER — ROSUVASTATIN CALCIUM 20 MG PO TABS
20.0000 mg | ORAL_TABLET | Freq: Every day | ORAL | Status: DC
  Administered 2019-01-13 – 2019-01-15 (×3): 20 mg
  Filled 2019-01-12 (×4): qty 1

## 2019-01-12 MED ORDER — BUSPIRONE HCL 5 MG PO TABS
5.0000 mg | ORAL_TABLET | Freq: Two times a day (BID) | ORAL | Status: DC
  Administered 2019-01-13 – 2019-01-15 (×6): 5 mg
  Filled 2019-01-12 (×8): qty 1

## 2019-01-12 MED ORDER — ZINC SULFATE 220 (50 ZN) MG PO CAPS
220.0000 mg | ORAL_CAPSULE | Freq: Every day | ORAL | Status: DC
  Administered 2019-01-13 – 2019-01-15 (×3): 220 mg
  Filled 2019-01-12 (×3): qty 1

## 2019-01-12 MED ORDER — METOCLOPRAMIDE HCL 5 MG/ML IJ SOLN
5.0000 mg | Freq: Once | INTRAMUSCULAR | Status: AC
  Administered 2019-01-12: 5 mg via INTRAVENOUS
  Filled 2019-01-12: qty 1

## 2019-01-12 MED ORDER — FREE WATER
100.0000 mL | Status: DC
  Administered 2019-01-12 – 2019-01-16 (×20): 100 mL

## 2019-01-12 MED ORDER — VITAMIN C 500 MG PO TABS
500.0000 mg | ORAL_TABLET | Freq: Every day | ORAL | Status: DC
  Administered 2019-01-13 – 2019-01-15 (×3): 500 mg
  Filled 2019-01-12 (×3): qty 1

## 2019-01-12 MED ORDER — GABAPENTIN 250 MG/5ML PO SOLN
100.0000 mg | Freq: Three times a day (TID) | ORAL | Status: DC
  Administered 2019-01-13 – 2019-01-15 (×9): 100 mg
  Filled 2019-01-12 (×13): qty 2

## 2019-01-12 MED ORDER — PREGABALIN 50 MG PO CAPS: 50.0000 mg | ORAL_CAPSULE | Freq: Three times a day (TID) | ORAL | Status: DC

## 2019-01-12 MED ORDER — SODIUM ZIRCONIUM CYCLOSILICATE 5 G PO PACK
5.0000 g | PACK | Freq: Once | ORAL | Status: DC
  Filled 2019-01-12: qty 1

## 2019-01-12 MED ORDER — SODIUM ZIRCONIUM CYCLOSILICATE 5 G PO PACK
5.0000 g | PACK | Freq: Two times a day (BID) | ORAL | Status: AC
  Administered 2019-01-12 (×2): 5 g via ORAL
  Filled 2019-01-12 (×2): qty 1

## 2019-01-12 MED ORDER — OXYCODONE HCL 5 MG PO TABS
5.0000 mg | ORAL_TABLET | Freq: Four times a day (QID) | ORAL | Status: DC
  Administered 2019-01-12 – 2019-01-16 (×15): 5 mg
  Filled 2019-01-12 (×14): qty 1

## 2019-01-12 MED ORDER — ASPIRIN 81 MG PO CHEW
81.0000 mg | CHEWABLE_TABLET | Freq: Every day | ORAL | Status: DC
  Administered 2019-01-13 – 2019-01-15 (×3): 81 mg
  Filled 2019-01-12 (×3): qty 1

## 2019-01-12 NOTE — Progress Notes (Signed)
NAME:  Carl Mayorquin., MRN:  [id number], DOB:  [birth year], LOS: 6 ADMISSION DATE:  02/01/19, CONSULTATION DATE:  02/01/2019 REFERRING MD: [name], CHIEF COMPLAINT:  dyspnea   Brief History   78 y/o male with baseline hypertension, parkinson's disease who presented to [facility] with increasing SOB.  Diagnosed with COVID on 11/23 and his daughter is assumed positive as well.  Admitted to [facility] on 11/30 and treated with HFNC but O2 level dropped early am (daughter reports to the 30's) on 12/1 and he was intubated.  Patient transferred to [facility] on 12/1 for care of severe acute respiratory failure with hypoxemia in setting of COVID PNA.  Past Medical History  Hypertension Parkinson's disease Hypothyroidism Peripheral neuralgia R Knee Swelling - 2 weeks prior to admit had a tap of fluid   Significant Hospital Events   12/01 Admit to [facility]  12/03 started prone positioning 12/06 prone position held due to significant facial swelling   Consults:    Procedures:  ETT 12/1 >> RUE PICC 12/4 >>  Significant Diagnostic Tests:    Micro Data:  SARS COV 2 11/23 >> positive OSH  Antimicrobials/COVID Rx  Decadron 12/1 >> Remdesivir 12/1 >> 12/05  Interim history/subjective:  Afebrile.  I/O - 2.6L UOP in 24h, net neg 1.7L in 24h.  Prone positioning held 12/6 due to significant facial swelling. RN reports episode of emesis this am with desaturation to the 60's.  PEEP 15, 80%, Peak 32, plat 26.   Objective   Blood pressure 108/60, pulse (!) 36, temperature (!) 97.3 F (36.3 C), resp. rate (!) 0, height 5\' 7"  (1.702 m), weight 78.1 kg, SpO2 94 %.    Vent Mode: PRVC FiO2 (%):  [60 %-80 %] 60 % Set Rate:  [24 bmp] 24 bmp Vt Set:  [500 mL] 500 mL PEEP:  [12 cmH20] 12 cmH20 Plateau Pressure:  [20 cmH20-24 cmH20] 24 cmH20   Intake/Output Summary (Last 24 hours) at 01/12/2019 0810 Last data filed at 01/12/2019 0631 Gross per 24 hour  Intake 828.18 ml  Output 2475 ml   Net -1646.82 ml   Filed Weights   01/08/19 0359 01/09/19 0500 01/10/19 0400  Weight: 73.8 kg 78.3 kg 78.1 kg    Examination: General: frail elderly male lying in bed in NAD on vent  HEENT: MM pink/moist, ETT, pupils 84mm / reactive, bruising to face Neuro: sedate  CV: s1s2 rrr, no m/r/g PULM:  Even/non-labored GI: abdomen soft, bsx4 active  Extremities: warm/dry, no significant edema  Skin: no rashes or lesions  CXR 12/7- images personally reviewed, diffuse bilateral, basilar predominant interstitial infiltrates, ETT / PICC line in good position  Resolved Hospital Problem list   AKI from ATN  Assessment & Plan:   Acute hypoxic respiratory failure with ARDS from COVID 19 pneumonia Completed remdesivir.  -D7/10 decadron -low Vt ventilation 4-8cc/kg -goal plateau pressure <30, driving pressure 09-13-1997 cm <63 -target PaO2 55-65, titrate PEEP/FiO2 per ARDS protocol  -hold prone positioning due to facial swelling / bruising  -goal CVP <4, diuresis as necessary -VAP prevention measures  -follow intermittent CXR   Acute metabolic encephalopathy Hx of Parkinson disease, anxiety -RASS goal -4 to -5 with PRN NMB -continue buspar, celexa, sinemet, oxycodone, lyrica   Emesis  -Reglan x1 -re-scheduled PT meds except sinemet, decadron until am  -Hold TF for 6 hours, resume at TF once restarted and advance as tolerated   Anemia of critical illness -follow CBC -transfuse for Hgb <7% or active bleeding  Hyperkalemia -follow BMP  Hypothyroidism -continue synthroid   Sinus bradycardia -hold home lopressor  -monitor hemodynamics in ICU   Hx of HTN, HLD -continue crestor, ASA  Steroid induced hyperglycemia. -SSI  Best practice:  Diet: TF DVT prophylaxis: lovenox  GI prophylaxis: famotidine Mobility:BR Code Status: Full Code Disposition: ICU Family: Son and Daughter updated via phone 12/7  Labs    CMP Latest Ref Rng & Units 01/12/2019 01/11/2019 01/11/2019  Glucose 70  - 99 mg/dL 154(H) - 177(H)  BUN 8 - 23 mg/dL 48(H) - 44(H)  Creatinine 0.61 - 1.24 mg/dL 0.93 - 0.89  Sodium 135 - 145 mmol/L 142 138 142  Potassium 3.5 - 5.1 mmol/L 5.4(H) 5.0 5.4(H)  Chloride 98 - 111 mmol/L 106 - 107  CO2 22 - 32 mmol/L 28 - 27  Calcium 8.9 - 10.3 mg/dL 7.5(L) - 7.6(L)  Total Protein 6.5 - 8.1 g/dL - - -  Total Bilirubin 0.3 - 1.2 mg/dL - - -  Alkaline Phos 38 - 126 U/L - - -  AST 15 - 41 U/L - - -  ALT 0 - 44 U/L - - -    CBC Latest Ref Rng & Units 01/12/2019 01/11/2019 01/11/2019  WBC 4.0 - 10.5 K/uL 8.2 - 7.9  Hemoglobin 13.0 - 17.0 g/dL 9.9(L) 9.9(L) 9.6(L)  Hematocrit 39.0 - 52.0 % 32.7(L) 29.0(L) 31.9(L)  Platelets 150 - 400 K/uL 214 - 178    ABG    Component Value Date/Time   PHART 7.354 01/11/2019 1148   PCO2ART 46.8 01/11/2019 1148   PO2ART 82.0 (L) 01/11/2019 1148   HCO3 26.3 01/11/2019 1148   TCO2 28 01/11/2019 1148   ACIDBASEDEF 3.0 (H) 01/10/2019 1212   O2SAT 96.0 01/11/2019 1148    CBG (last 3)  Recent Labs    01/11/19 1933 01/11/19 2326 01/12/19 0434  GLUCAP 158* 142* 139*    CC Time: 32 minutes  Noe Gens, MSN, NP-C [place] Pulmonary & Critical Care 01/12/2019, 8:10 AM   Please see [email or web address] for pager details.

## 2019-01-13 ENCOUNTER — Inpatient Hospital Stay (HOSPITAL_COMMUNITY): Payer: Medicare HMO

## 2019-01-13 DIAGNOSIS — J9601 Acute respiratory failure with hypoxia: Secondary | ICD-10-CM

## 2019-01-13 LAB — GLUCOSE, CAPILLARY
Glucose-Capillary: 95 mg/dL (ref 70–99)
Glucose-Capillary: 96 mg/dL (ref 70–99)
Glucose-Capillary: 128 mg/dL — ABNORMAL HIGH (ref 70–99)
Glucose-Capillary: 130 mg/dL — ABNORMAL HIGH (ref 70–99)
Glucose-Capillary: 130 mg/dL — ABNORMAL HIGH (ref 70–99)
Glucose-Capillary: 127 mg/dL — ABNORMAL HIGH (ref 70–99)

## 2019-01-13 LAB — POCT I-STAT 7, (LYTES, BLD GAS, ICA,H+H)
Acid-Base Excess: 1 mmol/L (ref 0.0–2.0)
Acid-base deficit: 1 mmol/L (ref 0.0–2.0)
Bicarbonate: 25.2 mmol/L (ref 20.0–28.0)
Bicarbonate: 27.3 mmol/L (ref 20.0–28.0)
Calcium, Ion: 1.18 mmol/L (ref 1.15–1.40)
Calcium, Ion: 1.17 mmol/L (ref 1.15–1.40)
HCT: 28 % — ABNORMAL LOW (ref 39.0–52.0)
HCT: 28 % — ABNORMAL LOW (ref 39.0–52.0)
Hemoglobin: 9.5 g/dL — ABNORMAL LOW (ref 13.0–17.0)
Hemoglobin: 9.5 g/dL — ABNORMAL LOW (ref 13.0–17.0)
O2 Saturation: 88 %
O2 Saturation: 72 %
Patient temperature: 37
Patient temperature: 37
Potassium: 5.6 mmol/L — ABNORMAL HIGH (ref 3.5–5.1)
Potassium: 5.5 mmol/L — ABNORMAL HIGH (ref 3.5–5.1)
Sodium: 139 mmol/L (ref 135–145)
Sodium: 139 mmol/L (ref 135–145)
TCO2: 27 mmol/L (ref 22–32)
TCO2: 29 mmol/L (ref 22–32)
pCO2 arterial: 46.9 mmHg (ref 32.0–48.0)
pCO2 arterial: 53.2 mmHg — ABNORMAL HIGH (ref 32.0–48.0)
pH, Arterial: 7.338 — ABNORMAL LOW (ref 7.350–7.450)
pH, Arterial: 7.318 — ABNORMAL LOW (ref 7.350–7.450)
pO2, Arterial: 59 mmHg — ABNORMAL LOW (ref 83.0–108.0)
pO2, Arterial: 42 mmHg — ABNORMAL LOW (ref 83.0–108.0)

## 2019-01-13 LAB — BASIC METABOLIC PANEL
Anion gap: 7 (ref 5–15)
BUN: 54 mg/dL — ABNORMAL HIGH (ref 8–23)
CO2: 27 mmol/L (ref 22–32)
Calcium: 7.4 mg/dL — ABNORMAL LOW (ref 8.9–10.3)
Chloride: 108 mmol/L (ref 98–111)
Creatinine, Ser: 0.85 mg/dL (ref 0.61–1.24)
GFR calc Af Amer: >60 mL/min (ref >60)
GFR calc non Af Amer: >60 mL/min (ref >60)
Glucose, Bld: 113 mg/dL — ABNORMAL HIGH (ref 70–99)
Potassium: 5.7 mmol/L — ABNORMAL HIGH (ref 3.5–5.1)
Sodium: 142 mmol/L (ref 135–145)

## 2019-01-13 LAB — CBC
HCT: 32.7 % — ABNORMAL LOW (ref 39.0–52.0)
Hemoglobin: 9.7 g/dL — ABNORMAL LOW (ref 13.0–17.0)
MCH: 28.1 pg (ref 26.0–34.0)
MCHC: 29.7 g/dL — ABNORMAL LOW (ref 30.0–36.0)
MCV: 94.8 fL (ref 80.0–100.0)
Platelets: 225 10*3/uL (ref 150–400)
RBC: 3.45 MIL/uL — ABNORMAL LOW (ref 4.22–5.81)
RDW: 14.3 % (ref 11.5–15.5)
WBC: 8.4 10*3/uL (ref 4.0–10.5)
nRBC: 0 % (ref 0.0–0.2)

## 2019-01-13 MED ORDER — VITAL 1.5 CAL PO LIQD
1000.0000 mL | ORAL | Status: DC
  Administered 2019-01-15: 1000 mL

## 2019-01-13 MED ORDER — SODIUM ZIRCONIUM CYCLOSILICATE 5 G PO PACK
5.0000 g | PACK | Freq: Two times a day (BID) | ORAL | Status: AC
  Administered 2019-01-13 (×2): 5 g via ORAL
  Filled 2019-01-13 (×2): qty 1

## 2019-01-13 MED ORDER — MAGNESIUM HYDROXIDE 400 MG/5ML PO SUSP
15.0000 mL | Freq: Once | ORAL | Status: AC
  Administered 2019-01-13: 15 mL
  Filled 2019-01-13: qty 30

## 2019-01-13 MED ORDER — PRO-STAT SUGAR FREE PO LIQD
30.0000 mL | Freq: Two times a day (BID) | ORAL | Status: DC
  Administered 2019-01-13 – 2019-01-15 (×6): 30 mL
  Filled 2019-01-13 (×6): qty 30

## 2019-01-13 MED ORDER — METOCLOPRAMIDE HCL 10 MG PO TABS
10.0000 mg | ORAL_TABLET | Freq: Three times a day (TID) | ORAL | Status: DC
  Administered 2019-01-13 – 2019-01-14 (×4): 10 mg via ORAL
  Filled 2019-01-13 (×7): qty 1

## 2019-01-13 MED ORDER — METOCLOPRAMIDE HCL 5 MG/5ML PO SOLN
10.0000 mg | Freq: Three times a day (TID) | ORAL | Status: DC
  Filled 2019-01-13 (×3): qty 10

## 2019-01-13 NOTE — Progress Notes (Signed)
Nutrition Follow-up  INTERVENTION:   -D/c Vital AF 1.2  -Resume tube feeding with Vital 1.5 @ 20 ml/hr.  -If tolerates, advance by 10 ml every 4 hours to goal rate of 55 ml/hr -30 ml Prostat BID -Provides 2180 kcals, 119g protein and 1008 ml H2O  NUTRITION DIAGNOSIS:   Increased nutrient needs related to acute illness(COVID-19) as evidenced by estimated needs.  Ongoing.  GOAL:   Patient will meet greater than or equal to 90% of their needs  Not meeting currently, TF off.  MONITOR:   Vent status, TF tolerance, Labs  ASSESSMENT:   78 yo male admitted with COVID PNA requring intubation. Tested positive for COVID 19 on 11/23. PMH includes HTN, Parkinson's disease, hypothyroidism, peripheral neuralgia.  12/1: admitted, intubated 12/2: Cortrak tube placed, post-pyloric 12/3: started prone positioning  **RD working remotely**  Patient having episodes of emesis. Pt was proned but developed facial swelling. TF was turned off. Per CCM today, TF can be restarted at 20 ml/hr. Pt may resume prone positioning on 12/9. Will adjust TF regimen to lessen the volume.   Patient is currently intubated on ventilator support MV: 12.9 L/min Temp (24hrs), Avg:98.5 F (36.9 C), Min:97.7 F (36.5 C), Max:99 F (37.2 C)  Admission weight: 162 lbs. Current weight: 173 lbs.  I/Os: +552 ml since admit UOP: 1565 ml x 24 hrs  Medications: Vitamin C tablet, Zinc sulfate  Labs reviewed: CBGs: 96-128 Elevated K  Diet Order:   Diet Order    None      EDUCATION NEEDS:   Not appropriate for education at this time  Skin:  Skin Assessment: Reviewed RN Assessment  Last BM:  12/6 -type 6  Height:   Ht Readings from Last 1 Encounters:  01/07/19 5\' 7"  (1.702 m)    Weight:   Wt Readings from Last 1 Encounters:  01/13/19 78.9 kg    Ideal Body Weight:  67.3 kg  BMI:  Body mass index is 27.24 kg/m.  Estimated Nutritional Needs:   Kcal:  1900-2200  Protein:  110-130  gm  Fluid:  >/= 1.9 L  Clayton Bibles, MS, RD, LDN Inpatient Clinical Dietitian Pager: [phone] After Hours Pager: [phone]

## 2019-01-13 NOTE — Progress Notes (Signed)
NAME:  Carl Nguyen., MRN:  [id number], DOB:  [birth year], LOS: 7 ADMISSION DATE:  01/07/2019, CONSULTATION DATE:  01/08/2019 REFERRING MD: [name], CHIEF COMPLAINT:  dyspnea   Brief History   78 y/o male with baseline hypertension, parkinson's disease who presented to [facility] with increasing SOB.  Diagnosed with COVID on 11/23 and his daughter is assumed positive as well.  Admitted to [facility] on 11/30 and treated with HFNC but O2 level dropped early am (daughter reports to the 30's) on 12/1 and he was intubated.  Patient transferred to [facility] on 12/1 for care of severe acute respiratory failure with hypoxemia in setting of COVID PNA.  Past Medical History  Hypertension Parkinson's disease Hypothyroidism Peripheral neuralgia R Knee Swelling - 2 weeks prior to admit had a tap of fluid   Significant Hospital Events   12/01 Admit to [facility]  12/03 started prone positioning 12/06 prone position held due to significant facial swelling  12/07 Prone positioning held 12/6 2/2 facial swelling. Episode of emesis. PEEP 15, 80%, Peak 32, plat 26.   Consults:    Procedures:  ETT 12/1 >> RUE PICC 12/4 >>  Significant Diagnostic Tests:    Micro Data:  SARS COV 2 11/23 >> positive OSH  Antimicrobials/COVID Rx  Decadron 12/1 >> Remdesivir 12/1 >> 12/05  Interim history/subjective:  Tmax 99, I/O- 1.2L UOP in last 24h, even balance.  Vecuronium dosing x2 in last 24 hours. RN reports ongoing tan secretions from mouth, ?'s TF.    Objective   Blood pressure (!) 107/51, pulse (!) 43, temperature 99 F (37.2 C), resp. rate 11, height 5\' 7"  (1.702 m), weight 78.9 kg, SpO2 96 %.    Vent Mode: PRVC FiO2 (%):  [65 %-100 %] 100 % Set Rate:  [24 bmp] 24 bmp Vt Set:  [500 mL] 500 mL PEEP:  [12 cmH20-14 cmH20] 12 cmH20 Plateau Pressure:  [24 cmH20-44 cmH20] 25 cmH20   Intake/Output Summary (Last 24 hours) at 01/13/2019 0820 Last data filed at 01/13/2019 0600 Gross per 24 hour   Intake 1458.26 ml  Output 1070 ml  Net 388.26 ml   Filed Weights   01/09/19 0500 01/10/19 0400 01/13/19 0500  Weight: 78.3 kg 78.1 kg 78.9 kg    Examination: General: frail elderly male lying in bed, critically ill appearing in NAD on vent  HEENT: MM pink/moist, ETT, no jvd, bruising to left eye/face, right cheek, pupils 15mm Neuro: sedate CV: s1s2 rrr, no m/r/g PULM:  Non-labored on vent, lungs bilaterally with rhonchi GI: soft, bsx4 active  Extremities: warm/dry, 1+ BUE dependent edema, none in LE's  Skin: no rashes or lesions.  Thin fragile skin with multiple areas of ecchymosis   Resolved Hospital Problem list   AKI from ATN  Assessment & Plan:   Acute hypoxic respiratory failure with ARDS from COVID 19 pneumonia Completed remdesivir. Peak airway pressure 24, Pplat 25, driving pressure 13 -3m decadron  -low Vt ventilation 4-8cc/kg -goal plateau pressure <30, driving pressure I7/78 cm <24 -target PaO2 55-65, titrate PEEP/FiO2 per ARDS protocol  -consider re-trial prone positioning on 12/9 -goal CVP <4, diuresis as necessary -VAP prevention measures  -follow intermittent CXR, ABG  Acute metabolic encephalopathy Hx of Parkinson disease, anxiety -RASS goal -4 to -5 with PRN NMB -continue buspar, sinemet, celexa, oxycodone, lyrica  Emesis  -assess KUB -monitor for further vomiting  Anemia of critical illness Trend CBC -transfuse for Hgb <7%, active bleeding  Hyperkalemia -trend BMP  -lokelma BID x2  Hypothyroidism -continue synthroid  Sinus bradycardia -hold home lopresor -monitor hemodynamics in ICU  Hx of HTN, HLD -continue crestor, ASA  Steroid induced hyperglycemia. -SSI  At Risk Malnutrition  -resume TF at 20 ml/hr  -monitor for tolerance   Best practice:  Diet: TF DVT prophylaxis: lovenox  GI prophylaxis: famotidine Mobility:BR Code Status: Full Code Disposition: ICU Family: Son updated via phone 12/8  Labs    CMP Latest Ref Rng  & Units 01/13/2019 01/13/2019 01/12/2019  Glucose 70 - 99 mg/dL 113(H) - 154(H)  BUN 8 - 23 mg/dL 54(H) - 48(H)  Creatinine 0.61 - 1.24 mg/dL 0.85 - 0.93  Sodium 135 - 145 mmol/L 142 139 142  Potassium 3.5 - 5.1 mmol/L 5.7(H) 5.6(H) 5.4(H)  Chloride 98 - 111 mmol/L 108 - 106  CO2 22 - 32 mmol/L 27 - 28  Calcium 8.9 - 10.3 mg/dL 7.4(L) - 7.5(L)  Total Protein 6.5 - 8.1 g/dL - - -  Total Bilirubin 0.3 - 1.2 mg/dL - - -  Alkaline Phos 38 - 126 U/L - - -  AST 15 - 41 U/L - - -  ALT 0 - 44 U/L - - -    CBC Latest Ref Rng & Units 01/13/2019 01/13/2019 01/12/2019  WBC 4.0 - 10.5 K/uL 8.4 - 8.2  Hemoglobin 13.0 - 17.0 g/dL 9.7(L) 9.5(L) 9.9(L)  Hematocrit 39.0 - 52.0 % 32.7(L) 28.0(L) 32.7(L)  Platelets 150 - 400 K/uL 225 - 214    ABG    Component Value Date/Time   PHART 7.338 (L) 01/13/2019 0405   PCO2ART 46.9 01/13/2019 0405   PO2ART 59.0 (L) 01/13/2019 0405   HCO3 25.2 01/13/2019 0405   TCO2 27 01/13/2019 0405   ACIDBASEDEF 1.0 01/13/2019 0405   O2SAT 88.0 01/13/2019 0405    CBG (last 3)  Recent Labs    01/12/19 2032 01/12/19 2328 01/13/19 0414  GLUCAP 106* 79 95    CC Time: 30 minutes  Noe Gens, MSN, NP-C [place] [facility] & Critical [facility] 01/13/2019, 8:20 AM   Please see [email or web address] for pager details.

## 2019-01-14 ENCOUNTER — Inpatient Hospital Stay (HOSPITAL_COMMUNITY): Payer: Medicare HMO

## 2019-01-14 LAB — POCT I-STAT 7, (LYTES, BLD GAS, ICA,H+H)
Acid-Base Excess: 2 mmol/L (ref 0.0–2.0)
Acid-Base Excess: 2 mmol/L (ref 0.0–2.0)
Bicarbonate: 28.1 mmol/L — ABNORMAL HIGH (ref 20.0–28.0)
Bicarbonate: 27.1 mmol/L (ref 20.0–28.0)
Calcium, Ion: 1.14 mmol/L — ABNORMAL LOW (ref 1.15–1.40)
Calcium, Ion: 1.13 mmol/L — ABNORMAL LOW (ref 1.15–1.40)
HCT: 27 % — ABNORMAL LOW (ref 39.0–52.0)
HCT: 28 % — ABNORMAL LOW (ref 39.0–52.0)
Hemoglobin: 9.2 g/dL — ABNORMAL LOW (ref 13.0–17.0)
Hemoglobin: 9.5 g/dL — ABNORMAL LOW (ref 13.0–17.0)
O2 Saturation: 71 %
O2 Saturation: 83 %
Patient temperature: 98.9
Patient temperature: 98.7
Potassium: 5.6 mmol/L — ABNORMAL HIGH (ref 3.5–5.1)
Potassium: 5.7 mmol/L — ABNORMAL HIGH (ref 3.5–5.1)
Sodium: 138 mmol/L (ref 135–145)
Sodium: 138 mmol/L (ref 135–145)
TCO2: 30 mmol/L (ref 22–32)
TCO2: 29 mmol/L (ref 22–32)
pCO2 arterial: 47.9 mmHg (ref 32.0–48.0)
pCO2 arterial: 46.6 mmHg (ref 32.0–48.0)
pH, Arterial: 7.377 (ref 7.350–7.450)
pH, Arterial: 7.374 (ref 7.350–7.450)
pO2, Arterial: 39 mmHg — CL (ref 83.0–108.0)
pO2, Arterial: 50 mmHg — ABNORMAL LOW (ref 83.0–108.0)

## 2019-01-14 LAB — BASIC METABOLIC PANEL
Anion gap: 3 — ABNORMAL LOW (ref 5–15)
BUN: 50 mg/dL — ABNORMAL HIGH (ref 8–23)
CO2: 29 mmol/L (ref 22–32)
Calcium: 7 mg/dL — ABNORMAL LOW (ref 8.9–10.3)
Chloride: 108 mmol/L (ref 98–111)
Creatinine, Ser: 0.71 mg/dL (ref 0.61–1.24)
GFR calc Af Amer: >60 mL/min (ref >60)
GFR calc non Af Amer: >60 mL/min (ref >60)
Glucose, Bld: 102 mg/dL — ABNORMAL HIGH (ref 70–99)
Potassium: 5.6 mmol/L — ABNORMAL HIGH (ref 3.5–5.1)
Sodium: 140 mmol/L (ref 135–145)

## 2019-01-14 LAB — CBC
HCT: 31.2 % — ABNORMAL LOW (ref 39.0–52.0)
Hemoglobin: 9.3 g/dL — ABNORMAL LOW (ref 13.0–17.0)
MCH: 28.3 pg (ref 26.0–34.0)
MCHC: 29.8 g/dL — ABNORMAL LOW (ref 30.0–36.0)
MCV: 94.8 fL (ref 80.0–100.0)
Platelets: 204 10*3/uL (ref 150–400)
RBC: 3.29 MIL/uL — ABNORMAL LOW (ref 4.22–5.81)
RDW: 14.3 % (ref 11.5–15.5)
WBC: 6.7 10*3/uL (ref 4.0–10.5)
nRBC: 0 % (ref 0.0–0.2)

## 2019-01-14 LAB — GLUCOSE, CAPILLARY
Glucose-Capillary: 92 mg/dL (ref 70–99)
Glucose-Capillary: 97 mg/dL (ref 70–99)
Glucose-Capillary: 133 mg/dL — ABNORMAL HIGH (ref 70–99)
Glucose-Capillary: 132 mg/dL — ABNORMAL HIGH (ref 70–99)
Glucose-Capillary: 166 mg/dL — ABNORMAL HIGH (ref 70–99)

## 2019-01-14 MED ORDER — DOXAZOSIN MESYLATE 2 MG PO TABS
2.0000 mg | ORAL_TABLET | Freq: Every day | ORAL | Status: DC
  Filled 2019-01-14: qty 1

## 2019-01-14 MED ORDER — TAMSULOSIN HCL 0.4 MG PO CAPS
0.4000 mg | ORAL_CAPSULE | Freq: Every day | ORAL | Status: DC
  Administered 2019-01-14 – 2019-01-15 (×2): 0.4 mg via ORAL
  Filled 2019-01-14 (×4): qty 1

## 2019-01-14 MED ORDER — SODIUM ZIRCONIUM CYCLOSILICATE 5 G PO PACK
5.0000 g | PACK | Freq: Two times a day (BID) | ORAL | Status: AC
  Administered 2019-01-14 (×2): 5 g via ORAL
  Filled 2019-01-14 (×2): qty 1

## 2019-01-14 MED ORDER — FUROSEMIDE 10 MG/ML IJ SOLN
40.0000 mg | Freq: Two times a day (BID) | INTRAMUSCULAR | Status: AC
  Administered 2019-01-14 – 2019-01-15 (×2): 40 mg via INTRAVENOUS
  Filled 2019-01-14 (×2): qty 4

## 2019-01-14 NOTE — Progress Notes (Signed)
NAME:  Carl Nguyen., MRN:  [id number], DOB:  [birth year], LOS: 8 ADMISSION DATE:  01/19/2019, CONSULTATION DATE:  01/30/2019 REFERRING MD: Venora Maples, CHIEF COMPLAINT:  dyspnea   Brief History   78 y/o male with baseline hypertension, parkinson's disease who presented to [facility] with increasing SOB.  Diagnosed with COVID on 11/23 and his daughter is assumed positive as well.  Admitted to [facility] on 11/30 and treated with HFNC but O2 level dropped early am (daughter reports to the 2's) on 12/1 and he was intubated.  Patient transferred to [facility] on 12/1 for care of severe acute respiratory failure with hypoxemia in setting of COVID PNA.  Past Medical History  Hypertension Parkinson's disease Hypothyroidism Peripheral neuralgia R Knee Swelling - 2 weeks prior to admit had a tap of fluid   Significant Hospital Events   12/01 Admit to [facility]  12/03 started prone positioning 12/06 prone position held due to significant facial swelling  12/07 Prone positioning held 12/6 2/2 facial swelling. Episode of emesis. PEEP 15, 80%, Peak 32, plat 26.   Consults:    Procedures:  ETT 12/1 >> RUE PICC 12/4 >>  Significant Diagnostic Tests:    Micro Data:  SARS COV 2 11/23 >> positive OSH  Antimicrobials/COVID Rx  Decadron 12/1 >> Remdesivir 12/1 >> 12/05  Interim history/subjective:  Afebrile.  I/O- UOP 875ml in 24h, +530 for 24h.  PAP 24, Pplat 27, PEEP 15, FiO2 100%, Driving pressure 12 Sedate on fentanyl / versed  RN reports episodes of desaturation with any movement  Objective   Blood pressure (!) 106/56, pulse (!) 43, temperature 98 F (36.7 C), temperature source Oral, resp. rate (!) 24, height 5\' 7"  (1.702 m), weight 79.1 kg, SpO2 96 %.    Vent Mode: PRVC FiO2 (%):  [80 %-100 %] 100 % Set Rate:  [24 bmp] 24 bmp Vt Set:  [500 mL] 500 mL PEEP:  [10 cmH20-15 cmH20] 15 cmH20 Plateau Pressure:  [22 cmH20-29 cmH20] 29 cmH20   Intake/Output Summary (Last 24  hours) at 01/14/2019 0815 Last data filed at 01/14/2019 0600 Gross per 24 hour  Intake 1309.45 ml  Output 650 ml  Net 659.45 ml   Filed Weights   01/10/19 0400 01/13/19 0500 01/14/19 0449  Weight: 78.1 kg 78.9 kg 79.1 kg    Examination: General: elderly male lying in bed on vent in NAD, appears critically ill   HEENT: MM pink/moist, ETT, facial bruising, thin skin, pupils pinpoint Neuro: sedate  CV: s1s2 rrr, SB on monitor , no m/r/g PULM:  Non-labored on vent, synchronous, rhonchi bilaterally  GI: soft, bsx4 active  Extremities: warm/dry, trace to 1+ generalized edema  Skin: no rashes or lesions  CXR 12/9 >> patchy basilar L>R predominant opacities  Resolved Hospital Problem list   AKI from ATN  Assessment & Plan:   Acute hypoxic respiratory failure with ARDS from COVID 19 pneumonia Completed remdesivir.  PAP 24, Pplat 27, PEEP 15, FiO2 100%, Driving pressure 12 -D9/10 Decadron  -low Vt ventilation 4-8cc/kg -goal plateau pressure <30, driving pressure <81 cm H2O -target PaO2 55-65, titrate PEEP/FiO2 per ARDS protocol  -if P/F ratio <150, consider prone therapy for 16 hours per day  -lasix 40 mg IV BID x2 doses  -VAP prevention measures  -follow intermittent CXR, ABG  Acute metabolic encephalopathy Hx of Parkinson disease, anxiety -RASS goal -4 to -5 with PRN NMB -continue celexa, oxycodone, lyrica, buspar, sinemet   Emesis  KUB negative 12/8. Feeding tube  in good position.  -monitor for further vomiting  Anemia of critical illness -trend CBC -transfuse for Hgb <7%, active bleeding  Hyperkalemia -follow BMP -lokelma 5 mg BID   Hypothyroidism -continue synthroid   Sinus bradycardia -hold home lopressor  -monitor hemodynamics  Hx of HTN, HLD -continue ASA, crestor  Steroid induced hyperglycemia. -SSI  At Risk Malnutrition  -continue TF, advance to goal  -monitor for tolerance  GOC Discussed nature of critical illness, advanced age, patients  prior wishes with daughter.  She indicates the patients wife died early in the year and had to have a trach (cancer).  She believes he would not want a trach.  We reviewed the concept of CPR with this level of ongoing support. I recommended no CPR in the event of arrest but continuing current level of support for now.  I asked her to talk with her brother about the concepts and we will revisit.  Continue current level of care.    Best practice:  Diet: TF DVT prophylaxis: lovenox  GI prophylaxis: famotidine Mobility:BR Code Status: Full Code Disposition: ICU Family: [name] (daughter) called for update 12/9  Labs    CMP Latest Ref Rng & Units 01/14/2019 01/14/2019 01/14/2019  Glucose 70 - 99 mg/dL - 408(X) -  BUN 8 - 23 mg/dL - 44(Y) -  Creatinine 1.85 - 1.24 mg/dL - 6.31 -  Sodium 497 - 145 mmol/L 138 140 138  Potassium 3.5 - 5.1 mmol/L 5.7(H) 5.6(H) 5.6(H)  Chloride 98 - 111 mmol/L - 108 -  CO2 22 - 32 mmol/L - 29 -  Calcium 8.9 - 10.3 mg/dL - 7.0(L) -  Total Protein 6.5 - 8.1 g/dL - - -  Total Bilirubin 0.3 - 1.2 mg/dL - - -  Alkaline Phos 38 - 126 U/L - - -  AST 15 - 41 U/L - - -  ALT 0 - 44 U/L - - -    CBC Latest Ref Rng & Units 01/14/2019 01/14/2019 01/14/2019  WBC 4.0 - 10.5 K/uL - 6.7 -  Hemoglobin 13.0 - 17.0 g/dL 0.2(O) 3.7(C) 5.8(I)  Hematocrit 39.0 - 52.0 % 28.0(L) 31.2(L) 27.0(L)  Platelets 150 - 400 K/uL - 204 -    ABG    Component Value Date/Time   PHART 7.374 01/14/2019 0532   PCO2ART 46.6 01/14/2019 0532   PO2ART 50.0 (L) 01/14/2019 0532   HCO3 27.1 01/14/2019 0532   TCO2 29 01/14/2019 0532   ACIDBASEDEF 1.0 01/13/2019 0405   O2SAT 83.0 01/14/2019 0532    CBG (last 3)  Recent Labs    01/13/19 2318 01/14/19 0414 01/14/19 0729  GLUCAP 127* 92 97    CC Time: 30 minutes  Canary Brim, MSN, NP-C [place] [facility] 01/14/2019, 8:15 AM   Please see [email or web address] for pager details.

## 2019-01-14 NOTE — Progress Notes (Signed)
Changes based on ABG result.  RT will obtain another ABG.

## 2019-01-15 LAB — GLUCOSE, CAPILLARY
Glucose-Capillary: 159 mg/dL — ABNORMAL HIGH (ref 70–99)
Glucose-Capillary: 177 mg/dL — ABNORMAL HIGH (ref 70–99)
Glucose-Capillary: 187 mg/dL — ABNORMAL HIGH (ref 70–99)
Glucose-Capillary: 192 mg/dL — ABNORMAL HIGH (ref 70–99)
Glucose-Capillary: 194 mg/dL — ABNORMAL HIGH (ref 70–99)
Glucose-Capillary: 205 mg/dL — ABNORMAL HIGH (ref 70–99)
Glucose-Capillary: 206 mg/dL — ABNORMAL HIGH (ref 70–99)

## 2019-01-15 LAB — CBC
HCT: 30 % — ABNORMAL LOW (ref 39.0–52.0)
Hemoglobin: 8.9 g/dL — ABNORMAL LOW (ref 13.0–17.0)
MCH: 28.4 pg (ref 26.0–34.0)
MCHC: 29.7 g/dL — ABNORMAL LOW (ref 30.0–36.0)
MCV: 95.8 fL (ref 80.0–100.0)
Platelets: 205 10*3/uL (ref 150–400)
RBC: 3.13 MIL/uL — ABNORMAL LOW (ref 4.22–5.81)
RDW: 14.3 % (ref 11.5–15.5)
WBC: 8.2 10*3/uL (ref 4.0–10.5)
nRBC: 0 % (ref 0.0–0.2)

## 2019-01-15 LAB — BASIC METABOLIC PANEL
Anion gap: 8 (ref 5–15)
BUN: 62 mg/dL — ABNORMAL HIGH (ref 8–23)
CO2: 26 mmol/L (ref 22–32)
Calcium: 7.1 mg/dL — ABNORMAL LOW (ref 8.9–10.3)
Chloride: 107 mmol/L (ref 98–111)
Creatinine, Ser: 0.96 mg/dL (ref 0.61–1.24)
GFR calc Af Amer: >60 mL/min (ref >60)
GFR calc non Af Amer: >60 mL/min (ref >60)
Glucose, Bld: 205 mg/dL — ABNORMAL HIGH (ref 70–99)
Potassium: 5.4 mmol/L — ABNORMAL HIGH (ref 3.5–5.1)
Sodium: 141 mmol/L (ref 135–145)

## 2019-01-15 MED ORDER — SODIUM POLYSTYRENE SULFONATE 15 GM/60ML PO SUSP
15.0000 g | Freq: Two times a day (BID) | ORAL | Status: AC
  Administered 2019-01-15 (×2): 15 g
  Filled 2019-01-15 (×2): qty 60

## 2019-01-15 MED ORDER — HYDROMORPHONE BOLUS VIA INFUSION
0.2000 mg | INTRAVENOUS | Status: DC | PRN
  Administered 2019-01-16: 0.2 mg via INTRAVENOUS
  Filled 2019-01-15: qty 1

## 2019-01-15 MED ORDER — POLYETHYLENE GLYCOL 3350 17 G PO PACK
17.0000 g | PACK | Freq: Two times a day (BID) | ORAL | Status: DC
  Administered 2019-01-15: 22:00:00 17 g
  Filled 2019-01-15: qty 1

## 2019-01-15 MED ORDER — VITAL 1.5 CAL PO LIQD
1000.0000 mL | ORAL | Status: DC
  Administered 2019-01-15: 1000 mL

## 2019-01-15 MED ORDER — SODIUM CHLORIDE 0.9 % IV SOLN
0.5000 mg/h | INTRAVENOUS | Status: DC
  Administered 2019-01-15: 0.5 mg/h via INTRAVENOUS
  Filled 2019-01-15: qty 5

## 2019-01-15 NOTE — Progress Notes (Signed)
Pt's daughter Helene Kelp called to unit. Updated of pt condition and plan of care. Helene Kelp appreciates update and care.

## 2019-01-15 NOTE — Progress Notes (Signed)
Attending:    Subjective: No major progress this week  Objective: Vitals:   01/15/19 1000 01/15/19 1100 01/15/19 1153 01/15/19 1200  BP: (!) 108/53 (!) 90/45  (!) 96/45  Pulse: (!) 48 (!) 51  (!) 52  Resp: 18 (!) 21  (!) 21  Temp:   98.3 F (36.8 C)   TempSrc:   Oral   SpO2: 90% (!) 89%  (!) 86%  Weight:      Height:       Vent Mode: PRVC FiO2 (%):  [70 %-100 %] 80 % Set Rate:  [24 bmp] 24 bmp Vt Set:  [500 mL] 500 mL PEEP:  [15 cmH20] 15 cmH20 Plateau Pressure:  [24 cmH20-31 cmH20] 27 cmH20  Intake/Output Summary (Last 24 hours) at 01/15/2019 1427 Last data filed at 01/15/2019 1249 Gross per 24 hour  Intake 2196.15 ml  Output 2815 ml  Net -618.85 ml    General:  In bed on vent HENT: NCAT ETT in place PULM: CTA B, vent supported breathing CV: RRR, no mgr GI: BS+, soft, nontender MSK: normal bulk and tone Neuro: sedated on vent Derm: bruising, thin skin, breakdown noted   CBC    Component Value Date/Time   WBC 8.2 01/15/2019 0425   RBC 3.13 (L) 01/15/2019 0425   HGB 8.9 (L) 01/15/2019 0425   HCT 30.0 (L) 01/15/2019 0425   PLT 205 01/15/2019 0425   MCV 95.8 01/15/2019 0425   MCH 28.4 01/15/2019 0425   MCHC 29.7 (L) 01/15/2019 0425   RDW 14.3 01/15/2019 0425   LYMPHSABS 0.3 (L) 01/12/2019 2002   MONOABS 0.6 2019/01/12 2002   EOSABS 0.0 01-12-19 2002   BASOSABS 0.0 January 12, 2019 2002    BMET    Component Value Date/Time   NA 141 01/15/2019 0425   K 5.4 (H) 01/15/2019 0425   CL 107 01/15/2019 0425   CO2 26 01/15/2019 0425   GLUCOSE 205 (H) 01/15/2019 0425   BUN 62 (H) 01/15/2019 0425   CREATININE 0.96 01/15/2019 0425   CALCIUM 7.1 (L) 01/15/2019 0425   GFRNONAA >60 01/15/2019 0425   GFRAA >60 01/15/2019 0425     Impression/Plan: ARDS due to COVID 19 pneumonia> still not much progress, I'm not confident he is going to do well, we have discussed this with family and let them know that we don't think a tracheostomy would be appropriate, his  daughter understands, is speaking to her brother about code status.  Continue ARDS protocol, VAP prevention, diurese as able Sedation: continue PAD protocol as ordered  Rest as per NP note  My cc time 32 minutes  Roselie Awkward, MD [place] [facility] Pager: [phone] Cell: [phone] After 3pm or if no response, call [phone]

## 2019-01-15 NOTE — Progress Notes (Signed)
Spoke on the phone with patient's daughter Helene Kelp. Updated her to patient's current condition.

## 2019-01-15 NOTE — Progress Notes (Signed)
NAME:  Carl Shifflett., MRN:  [id number], DOB:  [birth year], LOS: 9 ADMISSION DATE:  01/20/19, CONSULTATION DATE:  2019-01-20 REFERRING MD: Venora Maples, CHIEF COMPLAINT:  dyspnea   Brief History   78 y/o male with baseline hypertension, parkinson's disease who presented to [facility] with increasing SOB.  Diagnosed with COVID on 11/23 and his daughter is assumed positive as well.  Admitted to [facility] on 11/30 and treated with HFNC but O2 level dropped early am (daughter reports to the 61's) on 12/1 and he was intubated.  Patient transferred to [facility] on 12/1 for care of severe acute respiratory failure with hypoxemia in setting of COVID PNA.  Past Medical History  Hypertension Parkinson's disease Hypothyroidism Peripheral neuralgia R Knee Swelling - 2 weeks prior to admit had a tap of fluid   Significant Hospital Events   12/01 Admit to [facility]  12/03 started prone positioning 12/06 prone position held due to significant facial swelling  12/07 Prone positioning held 12/6 2/2 facial swelling. Episode of emesis. PEEP 15, 80%, Peak 32, plat 26.  12/10 Peak 29, Pplat 25, driving pressure 10, PEEP 15 (up from 10)  Consults:    Procedures:  ETT 12/1 >> RUE PICC 12/4 >>  Significant Diagnostic Tests:    Micro Data:  SARS COV 2 11/23 >> positive OSH  Antimicrobials/COVID Rx  Decadron 12/1 >> Remdesivir 12/1 >> 12/05  Interim history/subjective:  Afebrile PEEP 15 (from 10, then 12), 70% FiO2 Versed 7, fent 350 Peak 29, pplat 25, driving pressure 10 No emesis / acute events.   Objective   Blood pressure (!) 104/54, pulse (!) 48, temperature 98.8 F (37.1 C), temperature source Oral, resp. rate 14, height 5\' 7"  (1.702 m), weight 79.6 kg, SpO2 91 %.    Vent Mode: PRVC FiO2 (%):  [70 %-100 %] 70 % Set Rate:  [24 bmp] 24 bmp Vt Set:  [500 mL] 500 mL PEEP:  [15 cmH20] 15 cmH20 Plateau Pressure:  [24 cmH20-31 cmH20] 27 cmH20   Intake/Output Summary (Last 24  hours) at 01/15/2019 0932 Last data filed at 01/15/2019 0600 Gross per 24 hour  Intake 1937.7 ml  Output 1880 ml  Net 57.7 ml   Filed Weights   01/13/19 0500 01/14/19 0449 01/15/19 0500  Weight: 78.9 kg 79.1 kg 79.6 kg    Examination: General: elderly male lying in bed HEENT: MM pink/moist, ETT, bruising to face, thin skin, pupils pinpoint / equal Neuro: sedate  CV: s1s2 rrr, SB on monitor, no m/r/g PULM:  Occasional dyssynchrony on vent, non-labored, lungs bilaterally coarse  GI: soft, bsx4 active  Extremities: warm/dry, no edema  Skin: no rashes or lesions, thin fragile skin, multiple areas of ecchymosis   Resolved Hospital Problem list   AKI from ATN Emesis - KUB negative 12/8  Assessment & Plan:   Acute hypoxic respiratory failure with ARDS from COVID 19 pneumonia Completed remdesivir.  Peak 29, pplat 25, driving pressure 10, PEEP 15 -D10/10 decadron -hold prone positioning 21/10 with thin skin/facial edema  -low Vt ventilation 4-8cc/kg -goal plateau pressure <30, driving pressure <70 cm H2O -target PaO2 55-65, titrate PEEP/FiO2 per ARDS protocol  -VAP prevention measures  -follow intermittent CXR, ABG  Acute metabolic encephalopathy Hx of Parkinson disease, anxiety -RASS goal -4 to -5 with NMB -continue celexa, oxycodone, lyrica, buspar, sinemet  Anemia of critical illness -trend CBC -transfuse for Hgb <7% or active bleeding   Hyperkalemia -follow BMP  -continue lokelma -add kayexalate BID x2 doses -hold  lasix 12/10 with slight bump in sr cr / low muscle mass  Hypothyroidism -continue synthroid   Sinus bradycardia -hold home lopressor  -monitor hemodynamics  Hx of HTN, HLD -continue crestor, ASA  Steroid induced hyperglycemia. -SSI   At Risk Malnutrition  -continue TF per Nutrition  -monitor for tolerance   GOC Family updated via phone 12/10.  Reviewed concept of CPR with family & likely hood of not surviving in the event of arrest despite  level of current support. Family in agreement for No CPR in the event of arrest.  Continue current level of care.   Best practice:  Diet: TF DVT prophylaxis: lovenox  GI prophylaxis: famotidine Mobility:BR Code Status: Full Code Disposition: ICU Family: [name] (daughter) & Amante (son) updated via phone 12/10  Labs    CMP Latest Ref Rng & Units 01/15/2019 01/14/2019 01/14/2019  Glucose 70 - 99 mg/dL 614(E) - 315(Q)  BUN 8 - 23 mg/dL 00(Q) - 67(Y)  Creatinine 0.61 - 1.24 mg/dL 1.95 - 0.93  Sodium 267 - 145 mmol/L 141 138 140  Potassium 3.5 - 5.1 mmol/L 5.4(H) 5.7(H) 5.6(H)  Chloride 98 - 111 mmol/L 107 - 108  CO2 22 - 32 mmol/L 26 - 29  Calcium 8.9 - 10.3 mg/dL 7.1(L) - 7.0(L)  Total Protein 6.5 - 8.1 g/dL - - -  Total Bilirubin 0.3 - 1.2 mg/dL - - -  Alkaline Phos 38 - 126 U/L - - -  AST 15 - 41 U/L - - -  ALT 0 - 44 U/L - - -    CBC Latest Ref Rng & Units 01/15/2019 01/14/2019 01/14/2019  WBC 4.0 - 10.5 K/uL 8.2 - 6.7  Hemoglobin 13.0 - 17.0 g/dL 1.2(W) 5.8(K) 9.9(I)  Hematocrit 39.0 - 52.0 % 30.0(L) 28.0(L) 31.2(L)  Platelets 150 - 400 K/uL 205 - 204    ABG    Component Value Date/Time   PHART 7.374 01/14/2019 0532   PCO2ART 46.6 01/14/2019 0532   PO2ART 50.0 (L) 01/14/2019 0532   HCO3 27.1 01/14/2019 0532   TCO2 29 01/14/2019 0532   ACIDBASEDEF 1.0 01/13/2019 0405   O2SAT 83.0 01/14/2019 0532    CBG (last 3)  Recent Labs    01/15/19 0040 01/15/19 0402 01/15/19 0807  GLUCAP 177* 159* 194*    CC Time: 30 minutes  Canary Brim, MSN, NP-C [facility] 01/15/2019, 9:32 AM   Please see [email or web address] for pager details.

## 2019-01-15 NOTE — Progress Notes (Signed)
Fentanyl wasted with Brayton Layman. Amount wasted was 265mls in steri cycle

## 2019-01-15 NOTE — Progress Notes (Signed)
Nutrition Follow-up  INTERVENTION:   - Vital 1.5 @ 55 ml/hr -30 ml Prostat BID  -Provides 2180 kcals, 119g protein and 1008 ml H2O. Total free water: 1608 ml   NUTRITION DIAGNOSIS:   Increased nutrient needs related to acute illness(COVID-19) as evidenced by estimated needs.  Ongoing.  GOAL:   Patient will meet greater than or equal to 90% of their needs  Met  MONITOR:   Vent status, TF tolerance, Labs  ASSESSMENT:   78 yo male admitted with COVID PNA requring intubation. Tested positive for COVID 19 on 11/23. PMH includes HTN, Parkinson's disease, hypothyroidism, peripheral neuralgia.  12/1: admitted, intubated 12/2: Cortrak tube placed, post-pyloric 12/3: started prone positioning   Per MD no progress made. In discussion with family about trach, no longer proning pt. Pt now partial code.   Patient is currently intubated on ventilator support MV: 12 L/min Temp (24hrs), Avg:98.3 F (36.8 C), Min:98 F (36.7 C), Max:98.8 F (37.1 C)  Admission weight: 162 lbs. Current weight: 175 lbs.  Medications: decadron, miralax, kayexalate, Vitamin C tablet, Zinc sulfate  100 ml free water every 4 hours = 600 ml  Labs reviewed: CBGs: 96-128 Elevated K (5.4)  Diet Order:   Diet Order    None      EDUCATION NEEDS:   Not appropriate for education at this time  Skin:  Skin Assessment: Reviewed RN Assessment  Last BM:  12/6 -type 6  Height:   Ht Readings from Last 1 Encounters:  01/07/19 _0  (1.702 m)    Weight:   Wt Readings from Last 1 Encounters:  01/15/19 79.6 kg    Ideal Body Weight:  67.3 kg  BMI:  Body mass index is 27.49 kg/m.  Estimated Nutritional Needs:   Kcal:  1900-2200  Protein:  110-130 gm  Fluid:  >/= 1.9 L  Maylon Peppers RD, LDN, CNSC [phone] Pager [phone] After Hours Pager

## 2019-01-16 ENCOUNTER — Inpatient Hospital Stay (HOSPITAL_COMMUNITY): Payer: Medicare HMO

## 2019-01-16 DIAGNOSIS — U071 COVID-19: Secondary | ICD-10-CM | POA: Diagnosis present

## 2019-01-16 LAB — GLUCOSE, CAPILLARY: Glucose-Capillary: 216 mg/dL — ABNORMAL HIGH (ref 70–99)

## 2019-01-16 MED ORDER — PHENYLEPHRINE HCL-NACL 10-0.9 MG/250ML-% IV SOLN
0.0000 ug/min | INTRAVENOUS | Status: DC
  Administered 2019-01-16: 100 ug/min via INTRAVENOUS
  Filled 2019-01-16: qty 250

## 2019-02-06 NOTE — Progress Notes (Signed)
Patient's son and daughter notified of patient expiration.

## 2019-02-06 NOTE — Death Summary Note (Signed)
DEATH SUMMARY   Patient Details  Name: Carl SUEN Sr. MRN: [id number] DOB: 27-May-1940  Admission/Discharge Information   Admit Date:  2019-01-09  Date of Death: Date of Death: Jan 19, 2019  Time of Death: Time of Death: 0200  Length of Stay: 05/19/22  Referring Physician: Vito Backers, MD   Reason(s) for Hospitalization  Respiratory failure with hypoxia due to COVID-19  Diagnoses  Preliminary cause of death:  Hypoxic Respiratory failure  Secondary Diagnoses (including complications and co-morbidities):  Principal Problem:   Acute hypoxemic respiratory failure due to severe acute respiratory syndrome coronavirus 2 (SARS-CoV-2) disease (HCC) Active Problems:   Acute respiratory distress syndrome (ARDS) due to COVID-19 virus (HCC)   Bradycardia   Parkinson's disease [facility])  Brief Hospital Course (including significant findings, care, treatment, and services provided and events leading to death)  Carl Raring Sr. is a78 y/o male with baseline hypertension, parkinson's disease who presented to [facility] with increasing SOB.  Diagnosed with COVID on 11/23 and his daughter is positive as well.  Admitted to [facility] on 11/30 and treated with HFNC but O2 level dropped early am (daughter reports to the 30's) on 01-09-23 and he was intubated.  Patient transferred to [facility] on 01-09-23 for care of severe acute respiratory failure with hypoxemia in setting of COVID PNA.    Acute hypoxic respiratory failure with ARDS from COVID 19 pneumonia. He was intubated 09-Jan-2023 and remained on the ventilator throughout the hospitalization.  He was treated with 5 days of remdesivir, and was on day 6/10 of decadron.  He was placed in the prone positioning to keep PaO2:FiO2 < 150.  A negative fluid balance was maintained.  Unfortunately he became hypotensive requiring vasopressor support, he continued to deteriorate and subsequently died.   Acute metabolic encephalopathy. Hx of Parkinson disease,  anxiety. While on the ventilator he was sedated to RASS goal -4 to -5.  He received prn NMB for vent synchrony.  He was continued on sinemet, buspar, celexa, oxycodone, lyrica.  Hypothyroidism He was continued on synthroid  Pertinent Labs and Studies  Significant Diagnostic Studies DG Abd 1 View  Result Date: 01/12/2019 CLINICAL DATA:  Feeding tube dysfunction. EXAM: ABDOMEN - 1 VIEW COMPARISON:  No prior. FINDINGS: Feeding tube noted with tip noted projected over the distal portion of the duodenum. Low lung volumes with bibasilar infiltrates. Prior thoracic spine fusion. IMPRESSION: Feeding tube noted with tip noted projected over the distal portion of the duodenum. Electronically Signed   By: Maisie Fus  Register   On: 01/12/2019 08:36   DG Chest Port 1 View  Result Date: 01/14/2019 CLINICAL DATA:  79 year old with ventilator dependent respiratory failure with hypoxia. Follow-up COVID-19 pneumonia. EXAM: PORTABLE CHEST 1 VIEW COMPARISON:  01/12/2019 dating back to 2019-01-09. FINDINGS: Endotracheal tube tip in satisfactory position projecting approximately 7 cm above the carina. RIGHT arm PICC tip projects over the mid SVC, unchanged. Feeding tube courses below the diaphragm into the stomach. Since the examination 2 days ago, slight improvement in aeration in the RIGHT UPPER LOBE. Streaky and patchy opacities throughout both lungs are otherwise unchanged. However, there has been overall mild improvement over the past 5 days. IMPRESSION: 1. Support apparatus satisfactory. 2. Mild improvement in aeration in the RIGHT UPPER LOBE since the examination 2 days ago, otherwise no significant change in the BILATERAL pneumonia/ARDS. There has been overall mild improvement over the past 5 days. 3. No new abnormalities. Electronically Signed   By: Hulan Saas M.D.   On:  01/14/2019 08:34   DG CHEST PORT 1 VIEW  Result Date: 01/12/2019 CLINICAL DATA:  Aspiration. EXAM: PORTABLE CHEST 1 VIEW COMPARISON:   01/12/2019.  01/14/2019. FINDINGS: Endotracheal tube, feeding tube, right PICC line stable position. Heart size stable. Diffuse bilateral severe interstitial prominence is again noted this could be secondary to active interstitial process including pneumonitis and or underlying chronic interstitial lung disease. No pleural effusion or pneumothorax. Prior thoracic spine fusion. IMPRESSION: 1.  Lines and tubes in stable position. 2. Diffuse severe bilateral interstitial prominence is again noted without significant interim change. Electronically Signed   By: Maisie Fushomas  Register   On: 01/12/2019 09:10   DG Chest Port 1 View  Result Date: 01/12/2019 CLINICAL DATA:  Respiratory failure. EXAM: PORTABLE CHEST 1 VIEW COMPARISON:  01/11/2019 FINDINGS: Enteric tube courses into the stomach and off the film as tip is not visualized. Right-sided PICC line has tip over the SVC. Endotracheal tube tip is obscured by spinal stabilization hardware. Lungs are adequately inflated with persistent mixed interstitial airspace density over the mid to lower lungs which may be due to edema or infection. Possible small amount of bilateral pleural fluid. Cardiomediastinal silhouette and remainder of the exam is unchanged. IMPRESSION: Stable mixed interstitial airspace process over the mid to lower lungs which may be due to edema versus infection. Possible small amount of bilateral pleural fluid. Tubes and lines as described. Electronically Signed   By: Elberta Fortisaniel  Boyle M.D.   On: 01/12/2019 07:12   DG Chest Port 1 View  Result Date: 01/11/2019 CLINICAL DATA:  Respiratory failure. EXAM: PORTABLE CHEST 1 VIEW COMPARISON:  01/09/2019 FINDINGS: The ET tube tip is above the carina. Right arm PICC line tip is in the SVC. Feeding tube tip is below the field of view. Normal heart size. Bilateral interstitial and airspace opacities are noted throughout both lungs. When compared with the previous exam the aeration to lungs is not significantly  changed IMPRESSION: 1. No change in aeration to the lungs compared with previous exam. 2. Stable support apparatus. 3. No change in aeration to the lungs. Electronically Signed   By: Signa Kellaylor  Stroud M.D.   On: 01/11/2019 09:51   DG CHEST PORT 1 VIEW  Result Date: 01/09/2019 CLINICAL DATA:  Status post PICC placement. EXAM: PORTABLE CHEST 1 VIEW COMPARISON:  Same day. FINDINGS: Stable cardiomediastinal silhouette. Endotracheal and feeding tubes are unchanged in position. Interval placement of right-sided PICC line with distal tip in expected position of the SVC. No pneumothorax or pleural effusion is noted. Stable bilateral lung opacities are noted. Status post surgical posterior fusion of the lumbar spine. IMPRESSION: Interval placement of right-sided PICC line with distal tip in expected position of SVC. Otherwise stable support apparatus. Stable bilateral lung opacities as described above. Electronically Signed   By: Lupita RaiderJames  Green Jr M.D.   On: 01/09/2019 12:29   DG CHEST PORT 1 VIEW  Result Date: 01/09/2019 CLINICAL DATA:  Acute hypoxic respiratory failure. EXAM: PORTABLE CHEST 1 VIEW COMPARISON:  01/08/2019 FINDINGS: The endotracheal tube is approximately 6 cm above the carina. The feeding tube is coursing down the esophagus. Persistent bilateral perihilar predominant interstitial and airspace process. No pneumothorax or large pleural effusions. IMPRESSION: 1. Stable support apparatus. 2. Persistent perihilar predominant interstitial and airspace process. Electronically Signed   By: Rudie MeyerP.  Gallerani M.D.   On: 01/09/2019 07:52   DG CHEST PORT 1 VIEW  Result Date: 01/08/2019 CLINICAL DATA:  Respiratory failure with hypoxia. EXAM: PORTABLE CHEST 1 VIEW COMPARISON:  01/15/2019.  FINDINGS: Endotracheal tube and feeding tube in stable position. Heart size stable. Diffuse bilateral pulmonary interstitial prominence again noted interstitial edema and/or pneumonitis can present this fashion. Chronic interstitial  lung disease can not be excluded. Left base atelectasis progressed from prior exam. No pleural effusion or pneumothorax. IMPRESSION: 1.  Lines and tubes stable position. 2. Unchanged diffuse bilateral interstitial prominence again noted. Interstitial edema and/or pneumonitis can present in this fashion. Chronic interstitial lung disease cannot be excluded. 3.  Progressive left base atelectasis. Electronically Signed   By: Maisie Fus  Register   On: 01/08/2019 09:02   DG Chest Port 1 View  Result Date: 01/07/2019 CLINICAL DATA:  79 year old male with ARDS. Positive COVID-19. EXAM: PORTABLE CHEST 1 VIEW COMPARISON:  None. FINDINGS: An endotracheal tube is noted. The tip of the endotracheal tube superimposes over the spinal fixation hardware but appears above the carina. Enteric tube extends below the diaphragm with tip beyond the inferior margin of the image. There is background of mild chronic bronchitic changes. There is diffuse interstitial coarsening which may be chronic or represent edema. Atypical infiltrate is not excluded. Clinical correlation is recommended. There is no focal consolidation, pleural effusion, or pneumothorax. The cardiac silhouette is within normal limits. No acute osseous pathology. Spinal fixation hardware. IMPRESSION: 1. Endotracheal tube with tip obscured by the spinal fixation hardware but appears above the carina. 2. Diffuse interstitial coarsening which may be chronic or represent edema. Atypical infiltrate is not excluded. No focal consolidation. Electronically Signed   By: Elgie Collard M.D.   On: 01/08/2019 19:12   DG Abd Portable 1V  Result Date: 01/13/2019 CLINICAL DATA:  Emesis EXAM: PORTABLE ABDOMEN - 1 VIEW COMPARISON:  None. FINDINGS: Enteric tube tip is likely within the distal duodenum near the ligament of Treitz. Bowel-gas pattern is unremarkable on this single view. Thoracic fixation hardware is noted. IMPRESSION: Enteric tube within the distal duodenum. Unremarkable  bowel gas pattern. Electronically Signed   By: Guadlupe Spanish M.D.   On: 01/13/2019 13:20   [place] EKG SITE RITE  Result Date: 01/09/2019 If Site Rite image not attached, placement could not be confirmed due to current cardiac rhythm.   Microbiology Recent Results (from the past 240 hour(s))  MRSA PCR Screening     Status: None   Collection Time: 01/07/19  8:26 PM   Specimen: Nasal Mucosa; Nasopharyngeal  Result Value Ref Range Status   MRSA by PCR NEGATIVE NEGATIVE Final    Comment:        The GeneXpert MRSA Assay (FDA approved for NASAL specimens only), is one component of a comprehensive MRSA colonization surveillance program. It is not intended to diagnose MRSA infection nor to guide or monitor treatment for MRSA infections. Performed at [facility], [address] [place], [place] [address]     Lab Basic Metabolic Panel: Recent Labs  Lab 01/11/19 0555 01/12/19 0410 01/13/19 0440 01/13/19 1453 01/14/19 0259 01/14/19 0414 01/14/19 0532 01/15/19 0425  NA 142 142 142 139 138 140 138 141  K 5.4* 5.4* 5.7* 5.5* 5.6* 5.6* 5.7* 5.4*  CL 107 106 108  --   --  108  --  107  CO2 27 28 27   --   --  29  --  26  GLUCOSE 177* 154* 113*  --   --  102*  --  205*  BUN 44* 48* 54*  --   --  50*  --  62*  CREATININE 0.89 0.93 0.85  --   --  0.71  --  0.96  CALCIUM 7.6* 7.5* 7.4*  --   --  7.0*  --  7.1*   Liver Function Tests: No results for input(s): AST, ALT, ALKPHOS, BILITOT, PROT, ALBUMIN in the last 168 hours. No results for input(s): LIPASE, AMYLASE in the last 168 hours. No results for input(s): AMMONIA in the last 168 hours. CBC: Recent Labs  Lab 01/11/19 0555 01/12/19 0410 01/13/19 0440 01/13/19 1453 01/14/19 0259 01/14/19 0414 01/14/19 0532 01/15/19 0425  WBC 7.9 8.2 8.4  --   --  6.7  --  8.2  HGB 9.6* 9.9* 9.7* 9.5* 9.2* 9.3* 9.5* 8.9*  HCT 31.9* 32.7* 32.7* 28.0* 27.0* 31.2* 28.0* 30.0*  MCV 94.9 95.6 94.8  --   --  94.8  --  95.8   PLT 178 214 225  --   --  204  --  205   Cardiac Enzymes: No results for input(s): CKTOTAL, CKMB, CKMBINDEX, TROPONINI in the last 168 hours. Sepsis Labs: Recent Labs  Lab 01/12/19 0410 01/13/19 0440 01/14/19 0414 01/15/19 0425  WBC 8.2 8.4 6.7 8.2    Procedures/Operations     Carl Nguyen 02/02/2019, 3:21 AM

## 2019-02-06 NOTE — Progress Notes (Signed)
BLACK HAT  MAROON AND GREY SWEATER GREY T SHIRT UNDERWEAR PANTS BLACK BELT  PIECES OF PAPER WITH MED LIST  SMALL POCKET KNIFE BLUE BOX WITH PILLS IN IT NOT LABELED BLACK SHOES AND SOCKS IN IT

## 2019-02-06 NOTE — Progress Notes (Signed)
Patient hypotensive. E-link called and Dr. Lucile Shutters ordered Phenylephrine.

## 2019-02-06 NOTE — Progress Notes (Signed)
[facility] Progress Note Patient Name: Carl FORGETTE Sr. DOB: April 16, 1940 MRN: [id number]   Date of Service  2019-02-05  HPI/Events of Note  COVID-19  Pneumonia patient on the ventilator  with hypotension  eICU Interventions  Phenylephrine infusion ordered        Frederik Pear 02/05/19, 1:45 AM

## 2019-02-06 NOTE — Progress Notes (Signed)
80 mL Hydromorphone infusion wasted via sink with Felipa Eth RN.

## 2019-02-06 DEATH — deceased

## 2021-01-30 IMAGING — DX DG CHEST 1V PORT
1 series · 1 of 1 positions shown · non-contrast
Comparison: 01/06/2019.

CLINICAL DATA: Respiratory failure with hypoxia.

EXAM:
PORTABLE CHEST 1 VIEW

[chest ap]
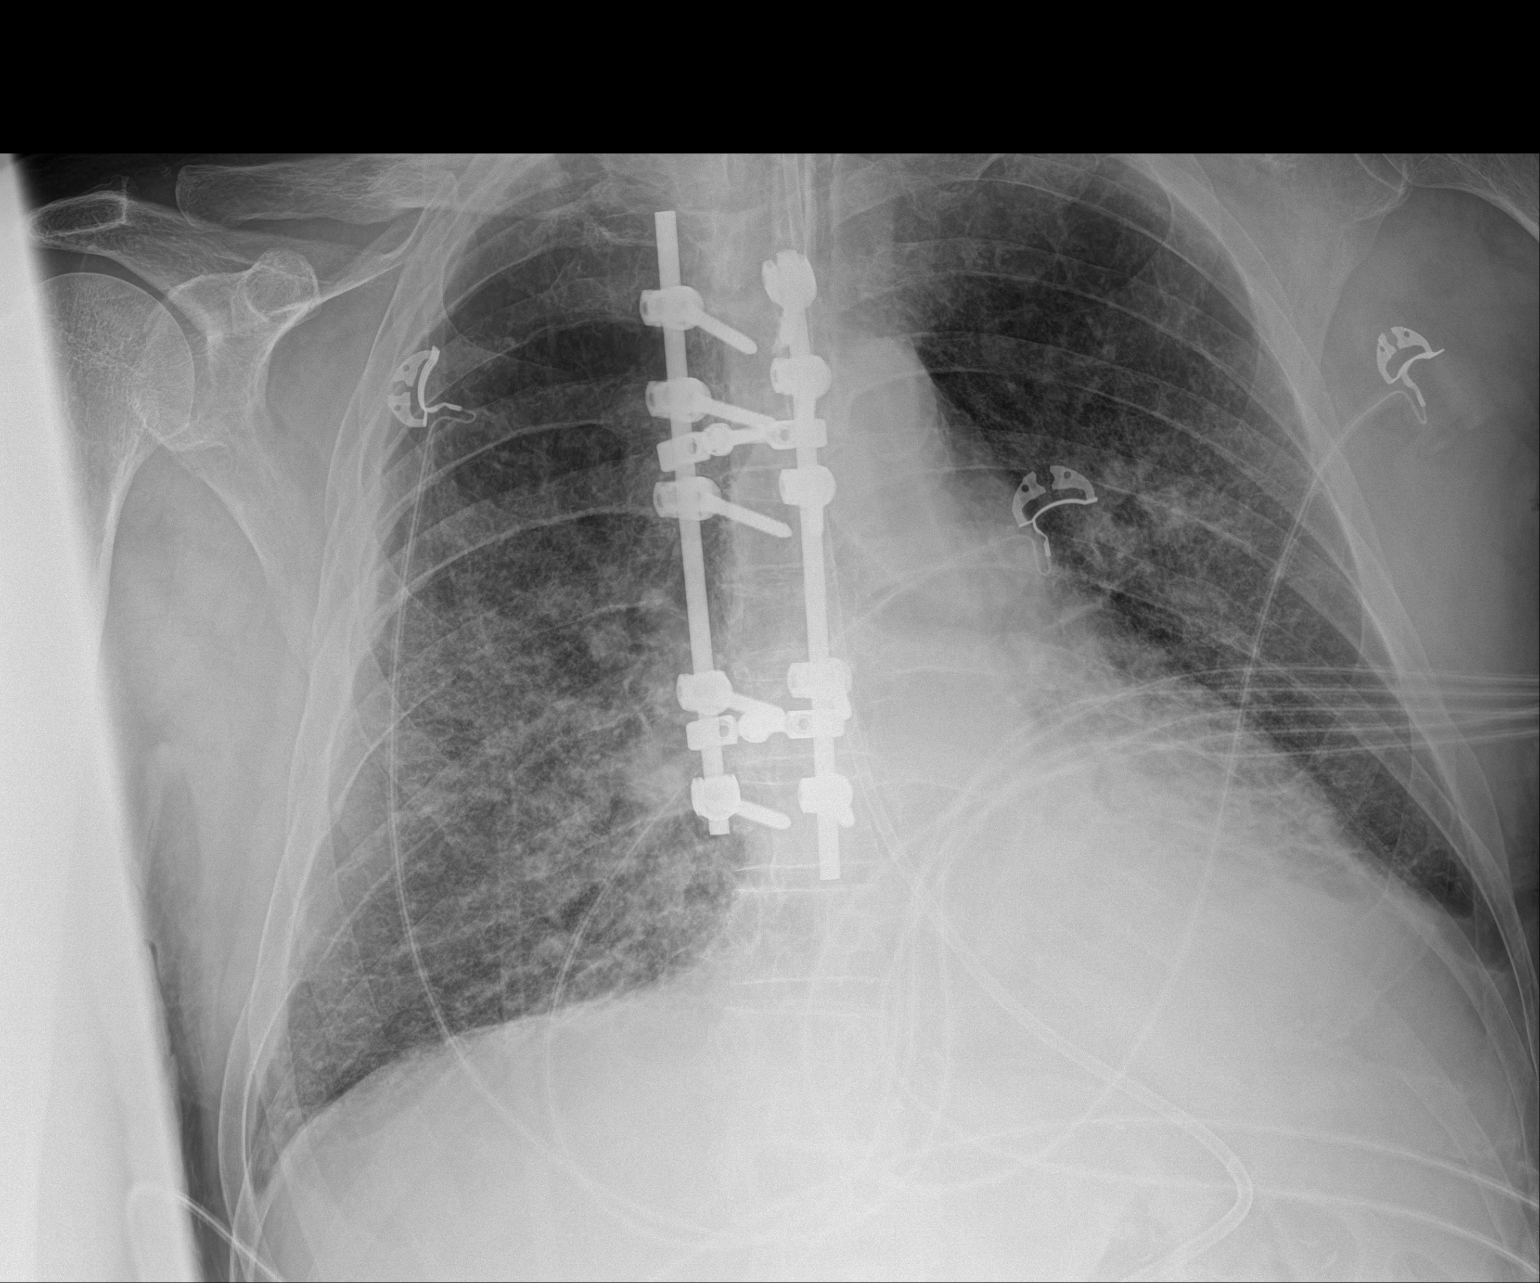

[1 of 1 positions shown; findings below may reference images not displayed]

FINDINGS: Endotracheal tube and feeding tube in stable position. Heart size
stable. Diffuse bilateral pulmonary interstitial prominence again
noted interstitial edema and/or pneumonitis can present this
fashion. Chronic interstitial lung disease can not be excluded. Left
base atelectasis progressed from prior exam. No pleural effusion or
pneumothorax.
IMPRESSION: 1.  Lines and tubes stable position.

2. Unchanged diffuse bilateral interstitial prominence again noted.
Interstitial edema and/or pneumonitis can present in this fashion.
Chronic interstitial lung disease cannot be excluded.

3.  Progressive left base atelectasis.

## 2021-01-31 IMAGING — DX DG CHEST 1V PORT
1 series · 1 of 1 positions shown · non-contrast
Comparison: Same day.

CLINICAL DATA: Status post PICC placement.

EXAM:
PORTABLE CHEST 1 VIEW

[chest ap]
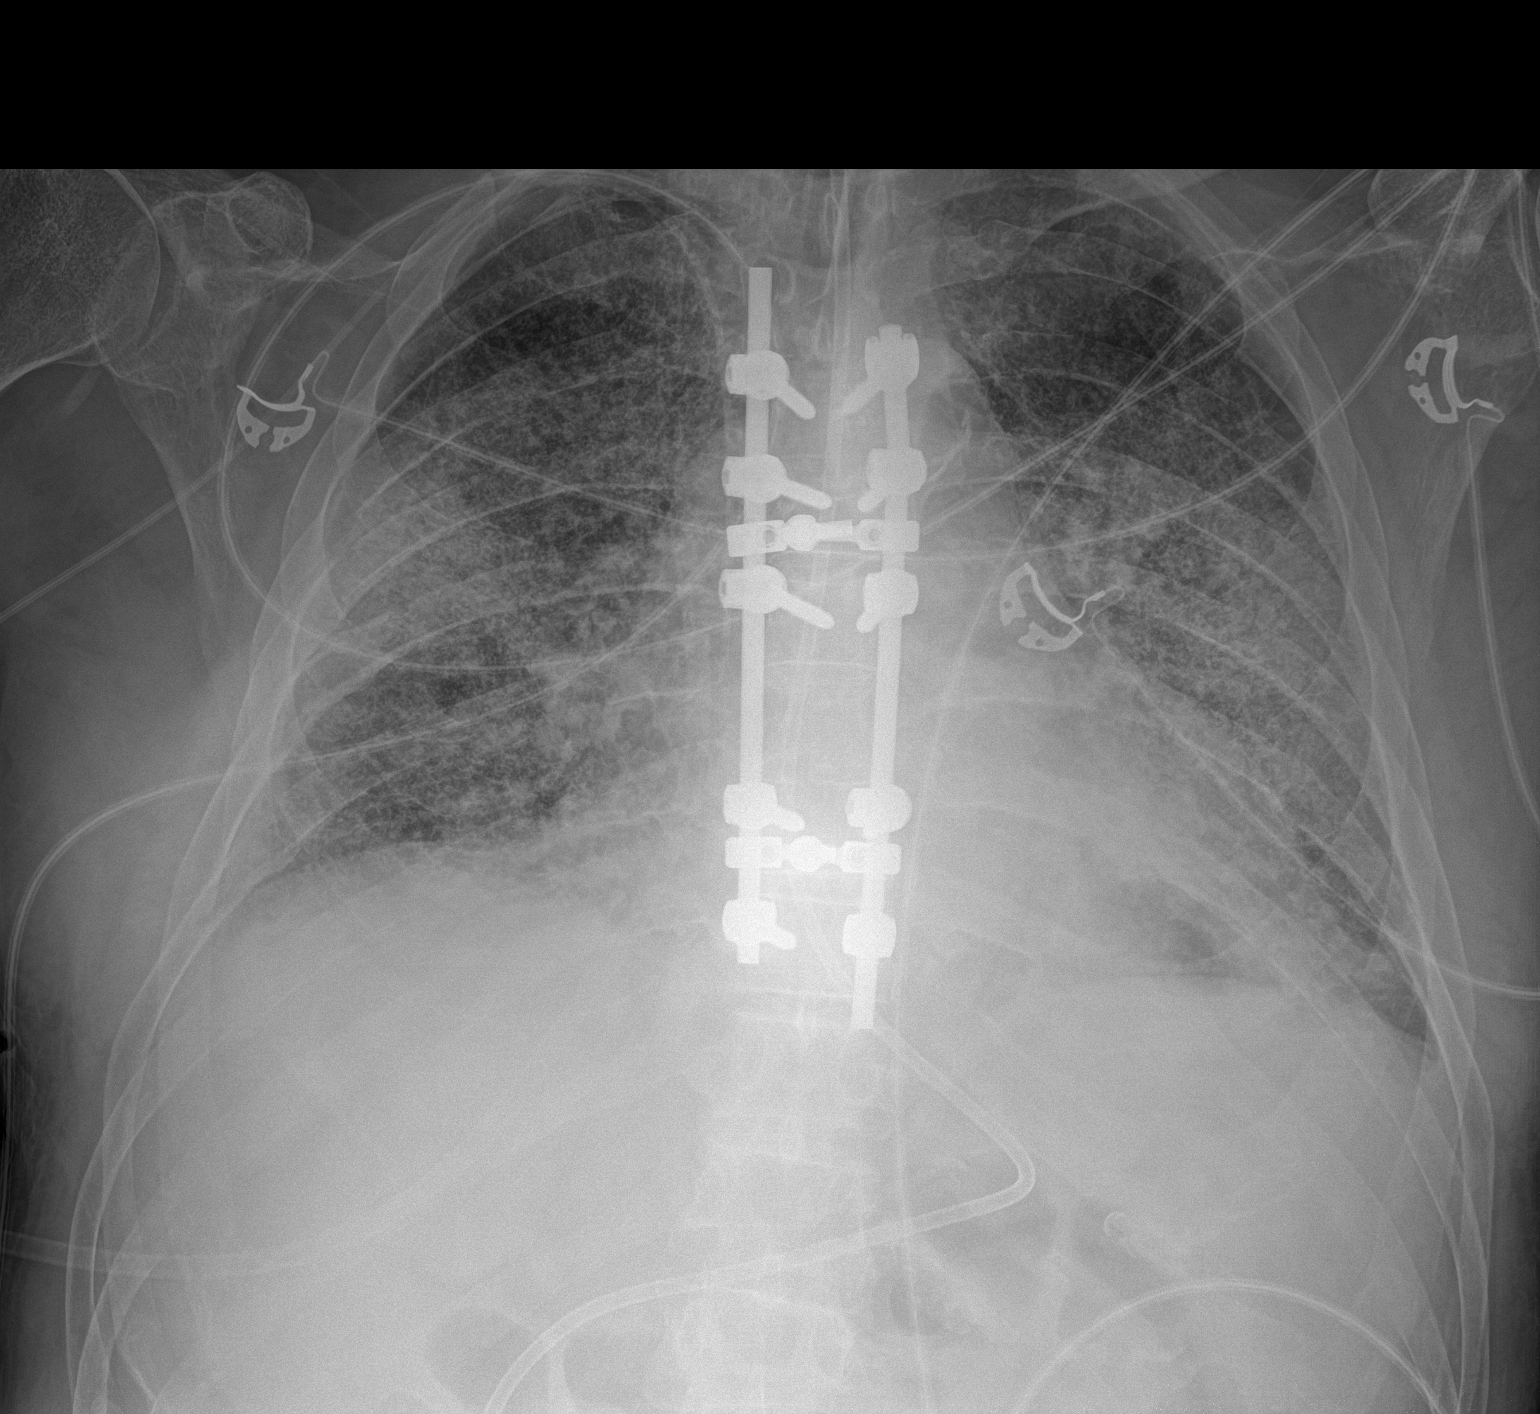

[1 of 1 positions shown; findings below may reference images not displayed]

FINDINGS: Stable cardiomediastinal silhouette. Endotracheal and feeding tubes
are unchanged in position. Interval placement of right-sided PICC
line with distal tip in expected position of the SVC. No
pneumothorax or pleural effusion is noted. Stable bilateral lung
opacities are noted. Status post surgical posterior fusion of the
lumbar spine.
IMPRESSION: Interval placement of right-sided PICC line with distal tip in
expected position of SVC. Otherwise stable support apparatus. Stable
bilateral lung opacities as described above.

## 2021-02-02 IMAGING — DX DG CHEST 1V PORT
1 series · 1 of 1 positions shown · non-contrast
Comparison: 01/09/2019

CLINICAL DATA: Respiratory failure.

EXAM:
PORTABLE CHEST 1 VIEW

[chest ap]
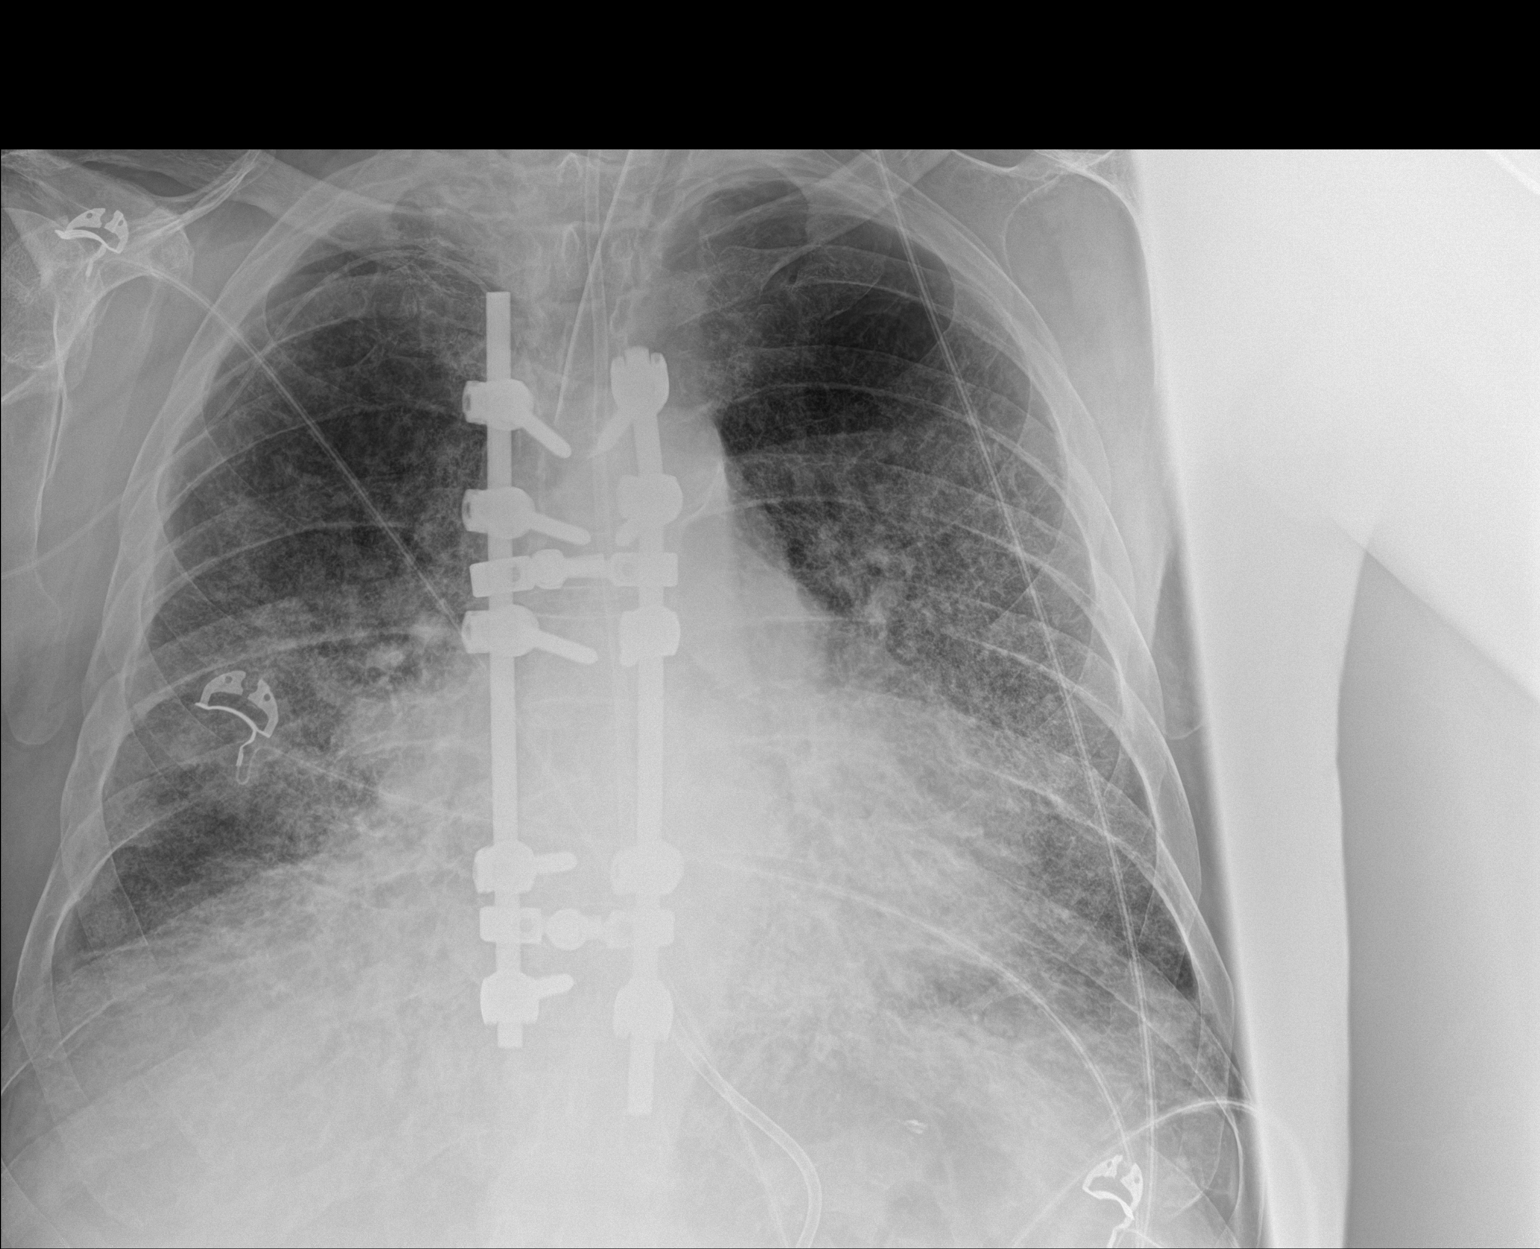

[1 of 1 positions shown; findings below may reference images not displayed]

FINDINGS: The ET tube tip is above the carina. Right arm PICC line tip is in
the SVC. Feeding tube tip is below the field of view. Normal heart
size. Bilateral interstitial and airspace opacities are noted
throughout both lungs. When compared with the previous exam the
aeration to lungs is not significantly changed
IMPRESSION: 1. No change in aeration to the lungs compared with previous exam.
2. Stable support apparatus.
3. No change in aeration to the lungs.

## 2021-02-03 IMAGING — DX DG CHEST 1V PORT
1 series · 1 of 1 positions shown · non-contrast
Comparison: 01/12/2019.  01/06/2019.

CLINICAL DATA: Aspiration.

EXAM:
PORTABLE CHEST 1 VIEW

[chest ap]
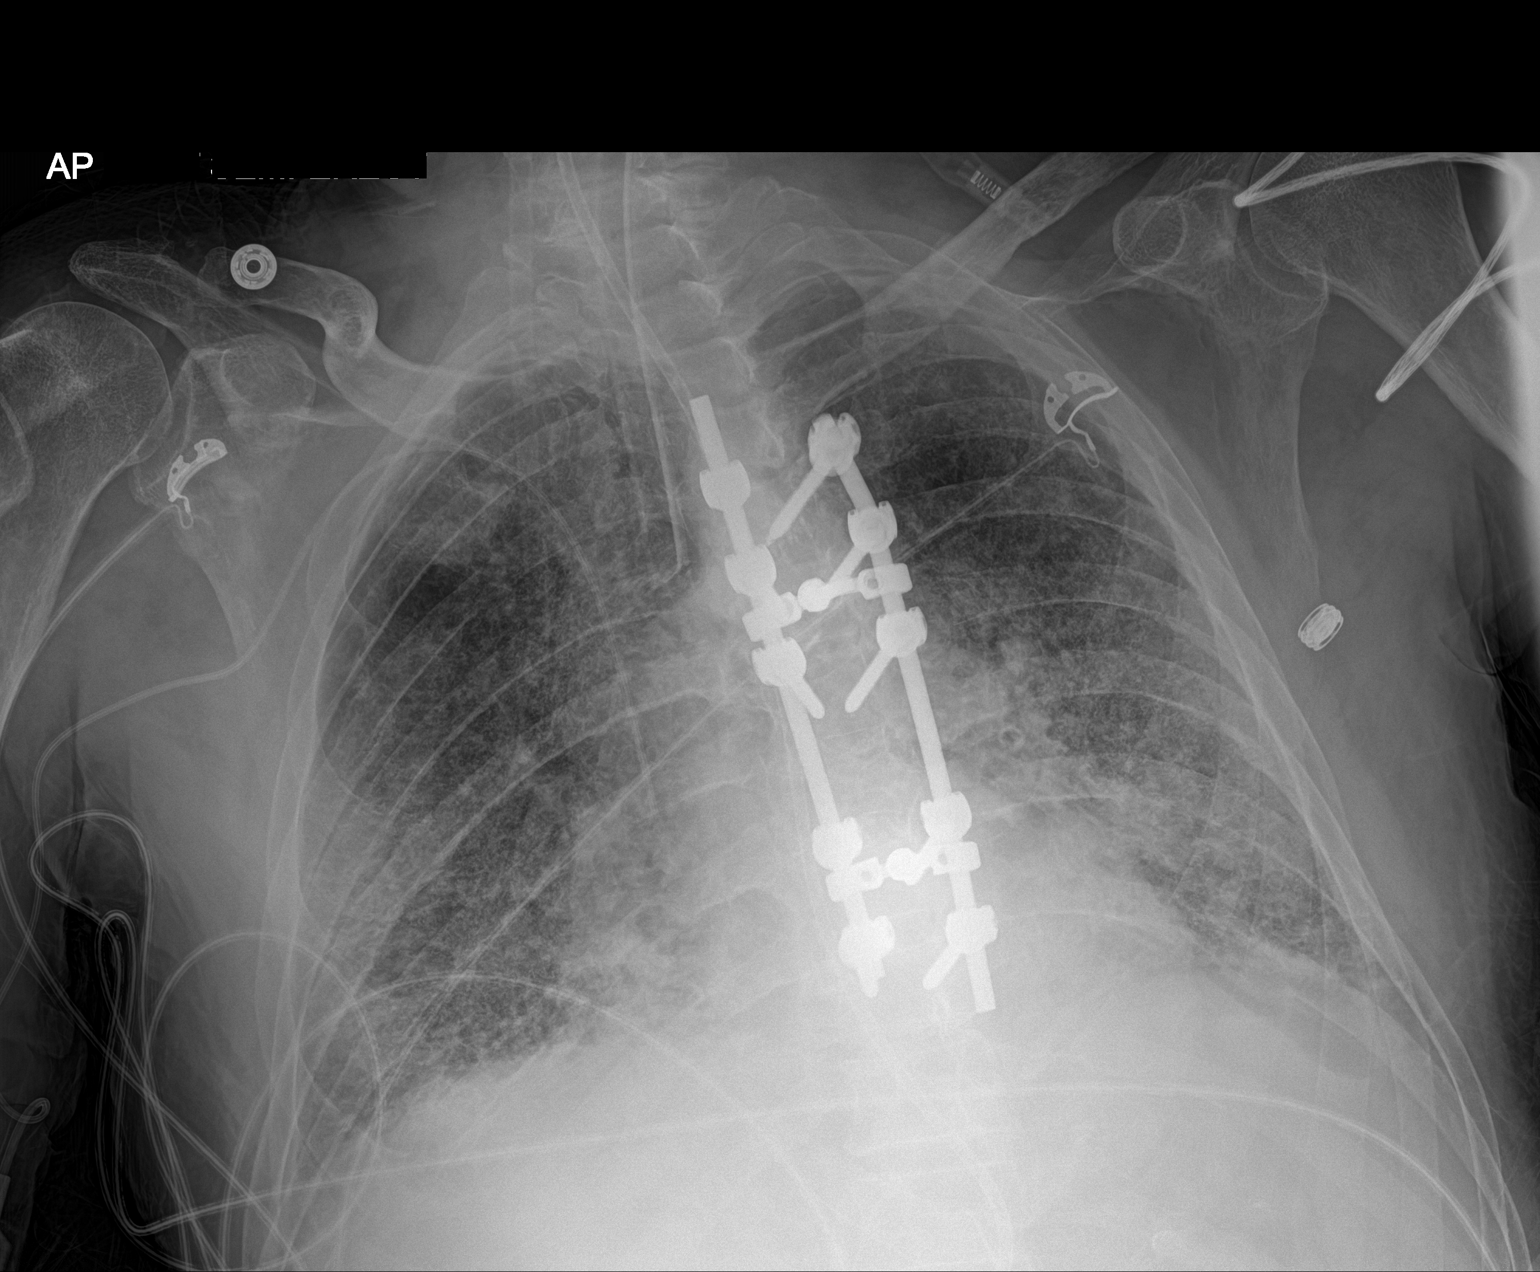

[1 of 1 positions shown; findings below may reference images not displayed]

FINDINGS: Endotracheal tube, feeding tube, right PICC line stable position.
Heart size stable. Diffuse bilateral severe interstitial prominence
is again noted this could be secondary to active interstitial
process including pneumonitis and or underlying chronic interstitial
lung disease. No pleural effusion or pneumothorax. Prior thoracic
spine fusion.
IMPRESSION: 1.  Lines and tubes in stable position.

2. Diffuse severe bilateral interstitial prominence is again noted
without significant interim change.

## 2021-02-03 IMAGING — DX DG ABDOMEN 1V
1 series · 1 of 1 positions shown · non-contrast
Comparison: No prior.

CLINICAL DATA: Feeding tube dysfunction.

EXAM:
ABDOMEN - 1 VIEW

[abdomen kub]
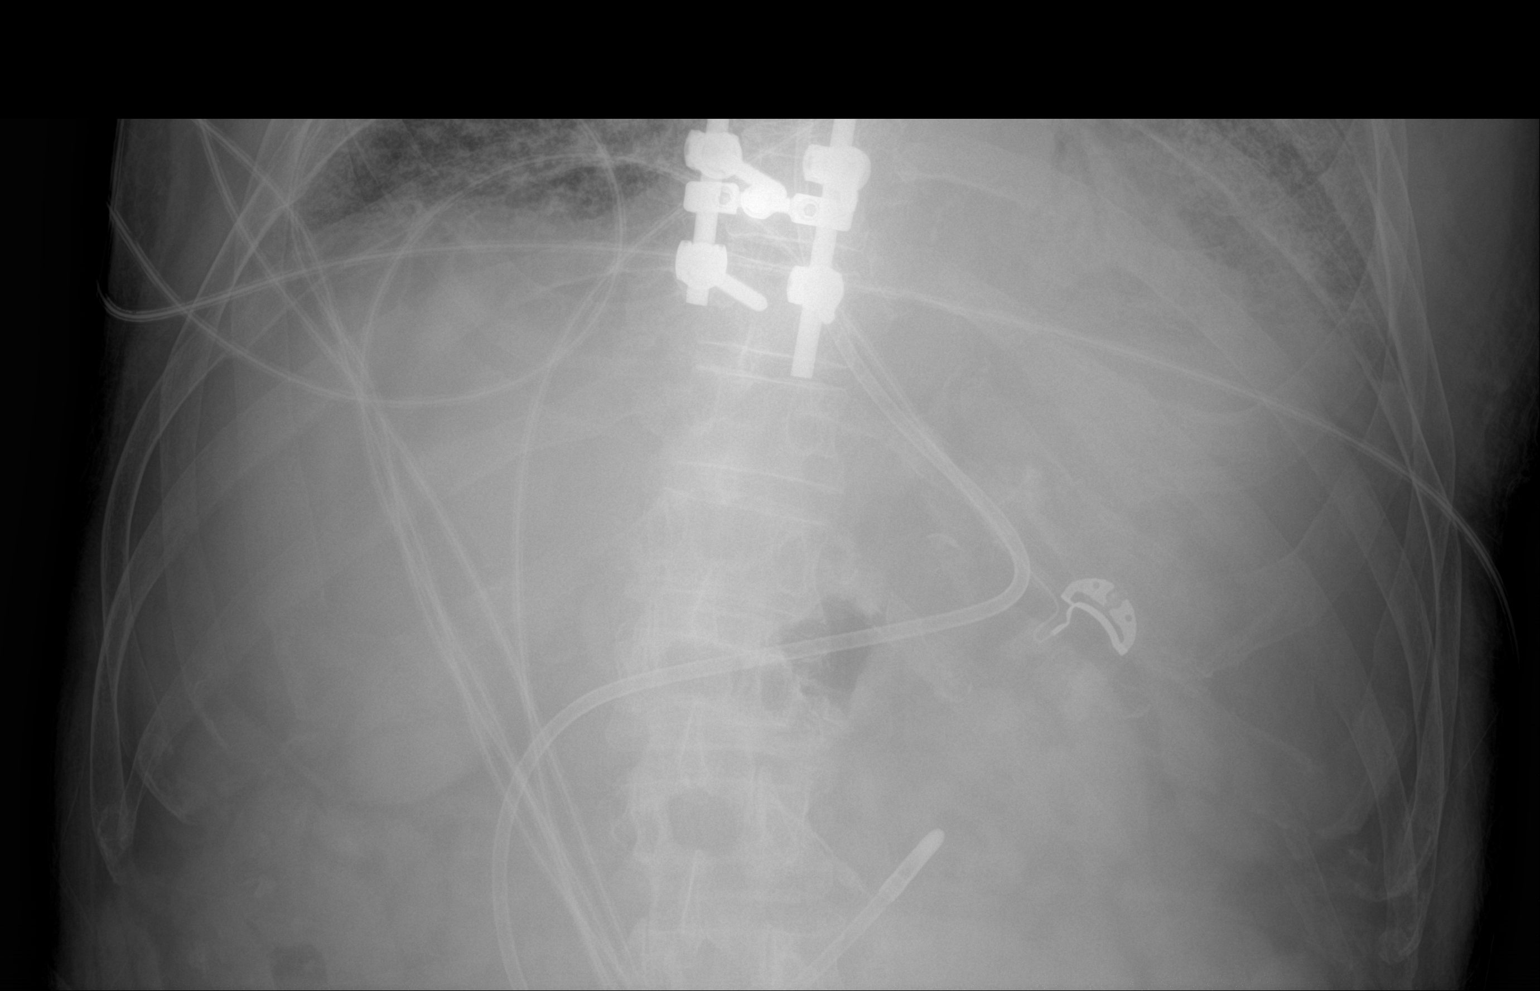

[1 of 1 positions shown; findings below may reference images not displayed]

FINDINGS: Feeding tube noted with tip noted projected over the distal portion
of the duodenum. Low lung volumes with bibasilar infiltrates. Prior
thoracic spine fusion.
IMPRESSION: Feeding tube noted with tip noted projected over the distal portion
of the duodenum.
